# Patient Record
Sex: Female | Born: 1940 | Race: Black or African American | Hispanic: No | Smoking: Former smoker
Health system: Southern US, Community
[De-identification: ages and names within clinical notes are randomized; demographics above are authoritative.]

## PROBLEM LIST (undated history)

## (undated) DIAGNOSIS — K5900 Constipation, unspecified: Secondary | ICD-10-CM

## (undated) DIAGNOSIS — E785 Hyperlipidemia, unspecified: Secondary | ICD-10-CM

## (undated) DIAGNOSIS — F0390 Unspecified dementia without behavioral disturbance: Secondary | ICD-10-CM

## (undated) DIAGNOSIS — N390 Urinary tract infection, site not specified: Secondary | ICD-10-CM

## (undated) DIAGNOSIS — Q245 Malformation of coronary vessels: Secondary | ICD-10-CM

## (undated) DIAGNOSIS — I679 Cerebrovascular disease, unspecified: Secondary | ICD-10-CM

## (undated) DIAGNOSIS — F32A Depression, unspecified: Secondary | ICD-10-CM

## (undated) DIAGNOSIS — R4182 Altered mental status, unspecified: Secondary | ICD-10-CM

## (undated) DIAGNOSIS — B009 Herpesviral infection, unspecified: Secondary | ICD-10-CM

## (undated) DIAGNOSIS — R1084 Generalized abdominal pain: Secondary | ICD-10-CM

## (undated) DIAGNOSIS — K219 Gastro-esophageal reflux disease without esophagitis: Secondary | ICD-10-CM

## (undated) DIAGNOSIS — F329 Major depressive disorder, single episode, unspecified: Secondary | ICD-10-CM

---

## 2008-02-02 ENCOUNTER — Inpatient Hospital Stay (HOSPITAL_COMMUNITY): Admission: EM | Admit: 2008-02-02 | Discharge: 2008-02-05 | Payer: Self-pay | Admitting: Cardiology

## 2008-03-06 ENCOUNTER — Inpatient Hospital Stay (HOSPITAL_COMMUNITY): Admission: EM | Admit: 2008-03-06 | Discharge: 2008-03-16 | Payer: Self-pay | Admitting: *Deleted

## 2008-07-20 ENCOUNTER — Observation Stay (HOSPITAL_COMMUNITY): Admission: EM | Admit: 2008-07-20 | Discharge: 2008-07-23 | Payer: Self-pay | Admitting: Emergency Medicine

## 2009-01-05 ENCOUNTER — Emergency Department (HOSPITAL_COMMUNITY): Admission: EM | Admit: 2009-01-05 | Discharge: 2009-01-05 | Payer: Self-pay | Admitting: Emergency Medicine

## 2009-01-12 ENCOUNTER — Emergency Department (HOSPITAL_COMMUNITY): Admission: EM | Admit: 2009-01-12 | Discharge: 2009-01-12 | Payer: Self-pay | Admitting: Emergency Medicine

## 2009-01-19 ENCOUNTER — Inpatient Hospital Stay (HOSPITAL_COMMUNITY): Admission: EM | Admit: 2009-01-19 | Discharge: 2009-01-20 | Payer: Self-pay | Admitting: Emergency Medicine

## 2009-01-23 ENCOUNTER — Emergency Department (HOSPITAL_COMMUNITY): Admission: EM | Admit: 2009-01-23 | Discharge: 2009-01-23 | Payer: Self-pay | Admitting: Emergency Medicine

## 2009-02-18 ENCOUNTER — Emergency Department (HOSPITAL_COMMUNITY): Admission: EM | Admit: 2009-02-18 | Discharge: 2009-02-18 | Payer: Self-pay | Admitting: Emergency Medicine

## 2010-01-17 ENCOUNTER — Inpatient Hospital Stay (HOSPITAL_COMMUNITY)
Admission: EM | Admit: 2010-01-17 | Discharge: 2010-01-20 | Payer: Self-pay | Source: Home / Self Care | Admitting: Emergency Medicine

## 2010-05-24 LAB — CBC
HCT: 27.9 % — ABNORMAL LOW (ref 36.0–46.0)
HCT: 35.4 % — ABNORMAL LOW (ref 36.0–46.0)
Hemoglobin: 11.4 g/dL — ABNORMAL LOW (ref 12.0–15.0)
MCH: 29.1 pg (ref 26.0–34.0)
MCHC: 32.6 g/dL (ref 30.0–36.0)
MCV: 90.3 fL (ref 78.0–100.0)
Platelets: 175 10*3/uL (ref 150–400)
RBC: 3.92 MIL/uL (ref 3.87–5.11)
RDW: 12.9 % (ref 11.5–15.5)
RDW: 13.1 % (ref 11.5–15.5)
WBC: 14.5 10*3/uL — ABNORMAL HIGH (ref 4.0–10.5)

## 2010-05-24 LAB — DIFFERENTIAL
Basophils Absolute: 0 10*3/uL (ref 0.0–0.1)
Basophils Absolute: 0 10*3/uL (ref 0.0–0.1)
Basophils Relative: 0 % (ref 0–1)
Eosinophils Absolute: 0.2 10*3/uL (ref 0.0–0.7)
Eosinophils Relative: 1 % (ref 0–5)
Lymphocytes Relative: 20 % (ref 12–46)
Monocytes Absolute: 1 10*3/uL (ref 0.1–1.0)
Neutro Abs: 6.3 10*3/uL (ref 1.7–7.7)
Neutrophils Relative %: 65 % (ref 43–77)

## 2010-05-24 LAB — BASIC METABOLIC PANEL
BUN: 24 mg/dL — ABNORMAL HIGH (ref 6–23)
Calcium: 8.9 mg/dL (ref 8.4–10.5)
Chloride: 104 mEq/L (ref 96–112)
GFR calc Af Amer: 45 mL/min — ABNORMAL LOW (ref >60)
GFR calc non Af Amer: 50 mL/min — ABNORMAL LOW (ref >60)
Glucose, Bld: 131 mg/dL — ABNORMAL HIGH (ref 70–99)
Potassium: 4.2 mEq/L (ref 3.5–5.1)

## 2010-05-24 LAB — FOLATE: Folate: 9.8 ng/mL

## 2010-05-24 LAB — COMPREHENSIVE METABOLIC PANEL
AST: 23 U/L (ref 0–37)
BUN: 16 mg/dL (ref 6–23)
CO2: 26 mEq/L (ref 19–32)
Chloride: 106 mEq/L (ref 96–112)
Creatinine, Ser: 0.9 mg/dL (ref 0.4–1.2)
GFR calc Af Amer: >60 mL/min (ref >60)
GFR calc non Af Amer: >60 mL/min (ref >60)
Total Bilirubin: 0.5 mg/dL (ref 0.3–1.2)

## 2010-05-24 LAB — URINE CULTURE

## 2010-05-24 LAB — URINALYSIS, ROUTINE W REFLEX MICROSCOPIC
Glucose, UA: NEGATIVE mg/dL
Hgb urine dipstick: NEGATIVE
Specific Gravity, Urine: 1.009 (ref 1.005–1.030)

## 2010-05-24 LAB — POCT CARDIAC MARKERS: Troponin i, poc: <0.05 ng/mL (ref 0.00–0.09)

## 2010-05-24 LAB — URINE MICROSCOPIC-ADD ON

## 2010-05-24 LAB — CARDIAC PANEL(CRET KIN+CKTOT+MB+TROPI)
CK, MB: 1.1 ng/mL (ref 0.3–4.0)
CK, MB: 1.3 ng/mL (ref 0.3–4.0)
Relative Index: INVALID (ref 0.0–2.5)
Troponin I: 0.01 ng/mL (ref 0.00–0.06)

## 2010-05-24 LAB — GLUCOSE, CAPILLARY
Glucose-Capillary: 134 mg/dL — ABNORMAL HIGH (ref 70–99)
Glucose-Capillary: 148 mg/dL — ABNORMAL HIGH (ref 70–99)
Glucose-Capillary: 175 mg/dL — ABNORMAL HIGH (ref 70–99)

## 2010-05-24 LAB — RETICULOCYTES
RBC.: 3.36 MIL/uL — ABNORMAL LOW (ref 3.87–5.11)
Retic Ct Pct: 1.3 % (ref 0.4–3.1)

## 2010-05-24 LAB — TSH: TSH: 0.476 u[IU]/mL (ref 0.350–4.500)

## 2010-05-24 LAB — FERRITIN: Ferritin: 314 ng/mL — ABNORMAL HIGH (ref 10–291)

## 2010-05-24 LAB — IRON AND TIBC: UIBC: 192 ug/dL

## 2010-05-24 LAB — HEMOGLOBIN A1C: Hgb A1c MFr Bld: 9.1 % — ABNORMAL HIGH (ref <5.7)

## 2010-05-24 LAB — VITAMIN B12: Vitamin B-12: 629 pg/mL (ref 211–911)

## 2010-05-24 LAB — TROPONIN I: Troponin I: 0.01 ng/mL (ref 0.00–0.06)

## 2010-06-15 LAB — URINALYSIS, ROUTINE W REFLEX MICROSCOPIC
Bilirubin Urine: NEGATIVE
Bilirubin Urine: NEGATIVE
Glucose, UA: 500 mg/dL — AB
Glucose, UA: NEGATIVE mg/dL
Hgb urine dipstick: NEGATIVE
Ketones, ur: NEGATIVE mg/dL
Ketones, ur: NEGATIVE mg/dL
Nitrite: NEGATIVE
Nitrite: NEGATIVE
Protein, ur: 100 mg/dL — AB
Protein, ur: NEGATIVE mg/dL
Specific Gravity, Urine: 1.019 (ref 1.005–1.030)
Specific Gravity, Urine: 1.023 (ref 1.005–1.030)
Urobilinogen, UA: 0.2 mg/dL (ref 0.0–1.0)
Urobilinogen, UA: 0.2 mg/dL (ref 0.0–1.0)
pH: 5.5 (ref 5.0–8.0)
pH: 5.5 (ref 5.0–8.0)

## 2010-06-15 LAB — GLUCOSE, CAPILLARY
Glucose-Capillary: 162 mg/dL — ABNORMAL HIGH (ref 70–99)
Glucose-Capillary: 200 mg/dL — ABNORMAL HIGH (ref 70–99)
Glucose-Capillary: 255 mg/dL — ABNORMAL HIGH (ref 70–99)

## 2010-06-15 LAB — BASIC METABOLIC PANEL
BUN: 82 mg/dL — ABNORMAL HIGH (ref 6–23)
CO2: 24 mEq/L (ref 19–32)
CO2: 28 mEq/L (ref 19–32)
Calcium: 9.1 mg/dL (ref 8.4–10.5)
Chloride: 105 mEq/L (ref 96–112)
Chloride: 107 mEq/L (ref 96–112)
Creatinine, Ser: 1.04 mg/dL (ref 0.4–1.2)
Creatinine, Ser: 2.57 mg/dL — ABNORMAL HIGH (ref 0.4–1.2)
GFR calc Af Amer: 38 mL/min — ABNORMAL LOW (ref >60)
GFR calc Af Amer: >60 mL/min (ref >60)
GFR calc non Af Amer: 19 mL/min — ABNORMAL LOW (ref >60)
Glucose, Bld: 116 mg/dL — ABNORMAL HIGH (ref 70–99)
Glucose, Bld: 153 mg/dL — ABNORMAL HIGH (ref 70–99)
Potassium: 3.8 mEq/L (ref 3.5–5.1)
Sodium: 136 mEq/L (ref 135–145)
Sodium: 141 mEq/L (ref 135–145)
Sodium: 141 mEq/L (ref 135–145)

## 2010-06-15 LAB — COMPREHENSIVE METABOLIC PANEL
ALT: 15 U/L (ref 0–35)
AST: 23 U/L (ref 0–37)
Alkaline Phosphatase: 59 U/L (ref 39–117)
CO2: 28 mEq/L (ref 19–32)
Calcium: 9.1 mg/dL (ref 8.4–10.5)
Chloride: 102 mEq/L (ref 96–112)
GFR calc Af Amer: 48 mL/min — ABNORMAL LOW (ref >60)
GFR calc non Af Amer: 39 mL/min — ABNORMAL LOW (ref >60)
Potassium: 4.4 mEq/L (ref 3.5–5.1)
Sodium: 140 mEq/L (ref 135–145)

## 2010-06-15 LAB — DIFFERENTIAL
Basophils Absolute: 0.1 K/uL (ref 0.0–0.1)
Basophils Relative: 1 % (ref 0–1)
Eosinophils Absolute: 0.1 10*3/uL (ref 0.0–0.7)
Eosinophils Absolute: 0.2 10*3/uL (ref 0.0–0.7)
Eosinophils Relative: 1 % (ref 0–5)
Lymphocytes Relative: 19 % (ref 12–46)
Lymphs Abs: 1.2 10*3/uL (ref 0.7–4.0)
Lymphs Abs: 2.2 10*3/uL (ref 0.7–4.0)
Monocytes Absolute: 0.6 10*3/uL (ref 0.1–1.0)
Monocytes Absolute: 0.7 10*3/uL (ref 0.1–1.0)
Monocytes Relative: 9 % (ref 3–12)
Monocytes Relative: 11 % (ref 3–12)
Neutro Abs: 4.4 10*3/uL (ref 1.7–7.7)
Neutrophils Relative %: 69 % (ref 43–77)
Neutrophils Relative %: 50 % (ref 43–77)

## 2010-06-15 LAB — URINE MICROSCOPIC-ADD ON

## 2010-06-15 LAB — PROTIME-INR
INR: 1 (ref 0.00–1.49)
Prothrombin Time: 13.1 s (ref 11.6–15.2)

## 2010-06-15 LAB — TSH: TSH: 0.169 u[IU]/mL — ABNORMAL LOW (ref 0.350–4.500)

## 2010-06-15 LAB — RAPID URINE DRUG SCREEN, HOSP PERFORMED
Amphetamines: NOT DETECTED
Barbiturates: NOT DETECTED
Benzodiazepines: NOT DETECTED
Cocaine: NOT DETECTED
Opiates: NOT DETECTED
Tetrahydrocannabinol: NOT DETECTED

## 2010-06-15 LAB — APTT: aPTT: 26 seconds (ref 24–37)

## 2010-06-15 LAB — BASIC METABOLIC PANEL WITH GFR
BUN: 24 mg/dL — ABNORMAL HIGH (ref 6–23)
Calcium: 9.2 mg/dL (ref 8.4–10.5)
GFR calc non Af Amer: 53 mL/min — ABNORMAL LOW (ref >60)
Glucose, Bld: 305 mg/dL — ABNORMAL HIGH (ref 70–99)
Potassium: 4.1 meq/L (ref 3.5–5.1)

## 2010-06-15 LAB — CBC
HCT: 32.6 % — ABNORMAL LOW (ref 36.0–46.0)
HCT: 38 % (ref 36.0–46.0)
Hemoglobin: 10.9 g/dL — ABNORMAL LOW (ref 12.0–15.0)
Hemoglobin: 11.1 g/dL — ABNORMAL LOW (ref 12.0–15.0)
Hemoglobin: 12.4 g/dL (ref 12.0–15.0)
MCHC: 34 g/dL (ref 30.0–36.0)
MCV: 89.7 fL (ref 78.0–100.0)
MCV: 90 fL (ref 78.0–100.0)
Platelets: 174 K/uL (ref 150–400)
RBC: 3.56 MIL/uL — ABNORMAL LOW (ref 3.87–5.11)
RBC: 3.64 MIL/uL — ABNORMAL LOW (ref 3.87–5.11)
RBC: 4.22 MIL/uL (ref 3.87–5.11)
RDW: 13 % (ref 11.5–15.5)
WBC: 6.1 10*3/uL (ref 4.0–10.5)
WBC: 6.4 10*3/uL (ref 4.0–10.5)
WBC: 6.2 10*3/uL (ref 4.0–10.5)

## 2010-06-15 LAB — URINE CULTURE: Colony Count: >=100000

## 2010-06-15 LAB — POCT CARDIAC MARKERS
CKMB, poc: 2.1 ng/mL (ref 1.0–8.0)
Myoglobin, poc: 88.2 ng/mL (ref 12–200)

## 2010-06-16 LAB — CULTURE, BLOOD (ROUTINE X 2): Culture: NO GROWTH

## 2010-06-16 LAB — CBC
HCT: 37.9 % (ref 36.0–46.0)
Hemoglobin: 12.8 g/dL (ref 12.0–15.0)
WBC: 7.2 10*3/uL (ref 4.0–10.5)

## 2010-06-16 LAB — DIFFERENTIAL
Basophils Absolute: 0.1 10*3/uL (ref 0.0–0.1)
Basophils Relative: 1 % (ref 0–1)
Monocytes Absolute: 0.3 10*3/uL (ref 0.1–1.0)
Neutro Abs: 5.6 10*3/uL (ref 1.7–7.7)
Neutrophils Relative %: 77 % (ref 43–77)

## 2010-06-16 LAB — URINALYSIS, ROUTINE W REFLEX MICROSCOPIC
Bilirubin Urine: NEGATIVE
Hgb urine dipstick: NEGATIVE
Specific Gravity, Urine: 1.018 (ref 1.005–1.030)
Urobilinogen, UA: 0.2 mg/dL (ref 0.0–1.0)

## 2010-06-16 LAB — COMPREHENSIVE METABOLIC PANEL
Alkaline Phosphatase: 68 U/L (ref 39–117)
BUN: 22 mg/dL (ref 6–23)
Chloride: 104 mEq/L (ref 96–112)
Glucose, Bld: 147 mg/dL — ABNORMAL HIGH (ref 70–99)
Potassium: 4.5 mEq/L (ref 3.5–5.1)
Total Bilirubin: 0.4 mg/dL (ref 0.3–1.2)

## 2010-06-16 LAB — URINE MICROSCOPIC-ADD ON

## 2010-06-16 LAB — POCT CARDIAC MARKERS: CKMB, poc: 1.2 ng/mL (ref 1.0–8.0)

## 2010-06-16 LAB — URINE CULTURE

## 2010-06-21 LAB — BASIC METABOLIC PANEL
CO2: 27 mEq/L (ref 19–32)
Calcium: 8.8 mg/dL (ref 8.4–10.5)
Chloride: 103 mEq/L (ref 96–112)
Creatinine, Ser: 0.77 mg/dL (ref 0.4–1.2)
GFR calc Af Amer: >60 mL/min (ref >60)
GFR calc Af Amer: >60 mL/min (ref >60)
GFR calc non Af Amer: 58 mL/min — ABNORMAL LOW (ref >60)
Glucose, Bld: 145 mg/dL — ABNORMAL HIGH (ref 70–99)
Potassium: 4 mEq/L (ref 3.5–5.1)
Sodium: 136 mEq/L (ref 135–145)
Sodium: 136 mEq/L (ref 135–145)

## 2010-06-21 LAB — CBC
HCT: 31.3 % — ABNORMAL LOW (ref 36.0–46.0)
Hemoglobin: 10.8 g/dL — ABNORMAL LOW (ref 12.0–15.0)
Hemoglobin: 12.2 g/dL (ref 12.0–15.0)
MCHC: 33.6 g/dL (ref 30.0–36.0)
MCHC: 34.5 g/dL (ref 30.0–36.0)
Platelets: 160 10*3/uL (ref 150–400)
RBC: 3.6 MIL/uL — ABNORMAL LOW (ref 3.87–5.11)
RBC: 4.15 MIL/uL (ref 3.87–5.11)
RDW: 13.8 % (ref 11.5–15.5)
WBC: 4.9 10*3/uL (ref 4.0–10.5)

## 2010-06-21 LAB — COMPREHENSIVE METABOLIC PANEL
ALT: 12 U/L (ref 0–35)
Albumin: 3.2 g/dL — ABNORMAL LOW (ref 3.5–5.2)
Alkaline Phosphatase: 53 U/L (ref 39–117)
CO2: 26 mEq/L (ref 19–32)
Calcium: 8.9 mg/dL (ref 8.4–10.5)
Chloride: 102 mEq/L (ref 96–112)
GFR calc Af Amer: >60 mL/min (ref >60)
GFR calc non Af Amer: >60 mL/min (ref >60)
Glucose, Bld: 238 mg/dL — ABNORMAL HIGH (ref 70–99)
Sodium: 137 mEq/L (ref 135–145)
Total Bilirubin: 0.6 mg/dL (ref 0.3–1.2)

## 2010-06-21 LAB — URINALYSIS, ROUTINE W REFLEX MICROSCOPIC
Bilirubin Urine: NEGATIVE
Hgb urine dipstick: NEGATIVE
Ketones, ur: NEGATIVE mg/dL
Nitrite: NEGATIVE
Protein, ur: NEGATIVE mg/dL
Urobilinogen, UA: 1 mg/dL (ref 0.0–1.0)
pH: 7 (ref 5.0–8.0)

## 2010-06-21 LAB — GLUCOSE, CAPILLARY
Glucose-Capillary: 119 mg/dL — ABNORMAL HIGH (ref 70–99)
Glucose-Capillary: 141 mg/dL — ABNORMAL HIGH (ref 70–99)
Glucose-Capillary: 148 mg/dL — ABNORMAL HIGH (ref 70–99)
Glucose-Capillary: 187 mg/dL — ABNORMAL HIGH (ref 70–99)
Glucose-Capillary: 282 mg/dL — ABNORMAL HIGH (ref 70–99)
Glucose-Capillary: 99 mg/dL (ref 70–99)

## 2010-06-21 LAB — DIFFERENTIAL
Basophils Absolute: 0 10*3/uL (ref 0.0–0.1)
Basophils Relative: 1 % (ref 0–1)
Eosinophils Absolute: 0.1 10*3/uL (ref 0.0–0.7)
Eosinophils Relative: 1 % (ref 0–5)
Lymphocytes Relative: 31 % (ref 12–46)
Lymphs Abs: 1.5 10*3/uL (ref 0.7–4.0)
Monocytes Absolute: 0.4 10*3/uL (ref 0.1–1.0)
Neutrophils Relative %: 59 % (ref 43–77)

## 2010-06-27 LAB — GLUCOSE, CAPILLARY
Glucose-Capillary: 120 mg/dL — ABNORMAL HIGH (ref 70–99)
Glucose-Capillary: 143 mg/dL — ABNORMAL HIGH (ref 70–99)
Glucose-Capillary: 145 mg/dL — ABNORMAL HIGH (ref 70–99)
Glucose-Capillary: 147 mg/dL — ABNORMAL HIGH (ref 70–99)
Glucose-Capillary: 160 mg/dL — ABNORMAL HIGH (ref 70–99)
Glucose-Capillary: 190 mg/dL — ABNORMAL HIGH (ref 70–99)

## 2010-06-27 LAB — BASIC METABOLIC PANEL
BUN: 19 mg/dL (ref 6–23)
Chloride: 102 mEq/L (ref 96–112)
GFR calc Af Amer: >60 mL/min (ref >60)
Potassium: 4.1 mEq/L (ref 3.5–5.1)

## 2010-06-27 LAB — CBC
HCT: 29.9 % — ABNORMAL LOW (ref 36.0–46.0)
MCV: 89 fL (ref 78.0–100.0)
RBC: 3.36 MIL/uL — ABNORMAL LOW (ref 3.87–5.11)
WBC: 4.7 10*3/uL (ref 4.0–10.5)

## 2010-07-26 NOTE — Procedures (Signed)
CLINICAL HISTORY:  The patient is a 70 year old back female, who [place]  [place], look confused, was brought to the  emergency.   TECHNICAL COMMENT:  An 18-channel EEG was performed based on standard  international 10-20 system.  Total recording time is 33.6 minutes.  A  17th-channel dedicated to EKG which demonstrated normal sinus rhythm of  60 beats per minute.   Upon awakening, the posterior background activity demonstrated marked  asymmetry, there was persistent slowing, mild dysrhythmic, theta  activity with some intermittent theta frequency activity noticed at left  T3, T5, and T7 leads, but there was no definite epileptiform discharge.  There was occasionally T3 sharp transient activity, the right side  posterior activity was 9 Hz, and reactive to eye opening and closure.  Again, there is no epileptiform discharge recorded.  Photic stimulation  and hyperventilation was performed.  There was persistent left temporal  area slowing.   CONCLUSION:  This is an abnormal awake EEG.  There is evidence of focal  slowing involving the left temporal region, could represent underlying  structural lesion or postictal event, MRI studies warranted.      Levert Feinstein, MD  Electronically Signed     ZO:[id number]  D:  02/05/2008 20:05:32  T:  02/06/2008 03:56:04  Job #:  [id number]

## 2010-07-26 NOTE — Discharge Summary (Signed)
[name] [name], [name] [name]             ACCOUNT NO.:  [id number]   MEDICAL RECORD NO.:  [id number]          PATIENT TYPE:  INP   LOCATION:  6709                         FACILITY:  [facility]   PHYSICIAN:  Osvaldo Shipper. Spruill, M.D.DATE OF BIRTH:  1941/01/05   DATE OF ADMISSION:  03/06/2008  DATE OF DISCHARGE:  03/13/2008                               DISCHARGE SUMMARY   ADMISSION AND PROVISIONAL DIAGNOSES:  1. Syncope.  2. Hyperglycemia.  3. Dementia.  4. Dehydration.  5. History of cerebrovascular accident.  6. History of tobacco abuse.   DISCHARGE DIAGNOSES:  1. Dementia.  2. Near syncope.  3. Orthostatic hypotension.  4. Dehydration.  5. Uncontrolled diabetes mellitus.  6. Hypertension.  7. History of remote stroke.   BRIEF HISTORY AND REASON FOR ADMISSION:  This is one of several  admissions for this 70 year old black woman with a history of dementia,  previous stroke who was presented to the hospital with a difficult  history.  The patient had primarily stated to [facility] that  she had passed out and [facility] was called at 3:20 in the  morning.   When the patient was first evaluated in the emergency room, she was  found to have a blood sugar of 583.  She was subsequently admitted to  the hospital for treatment of suspected dehydration and orthostatic  hypotension.  The patient was admitted in my absence by Dr. Rinaldo Cloud.   LABORATORY STUDIES:  Laboratory studies revealed the following:  On  initial presentation, the patient's serum glucose was recorded at 449,  serum sodium 135, potassium 4.6, chloride 99, CO2 content 27, creatinine  1.38.  The patient's CBC revealed a white blood cell count of 7000,  hemoglobin 12.0, hematocrit 36.9.  The patient also had urinalysis,  which revealed greater than 1000 mg/dL of urine glucose.  Serum troponin  was 0.02 and CK was 52 with no CK-MB present.  At the time of discharge,  the patient's BMET revealed serum  sodium of 136, potassium 4.1, chloride  102, CO2 content 27, glucose 150, BUN 19, creatinine 0.74.   RADIOLOGIC STUDIES:  Chest x-ray, mild cardiomegaly without acute  disease.  MRI was normal of the patient's brain some time ago and back  in November and did not reveal any intracranial mass, several old  infarcts were noted.  No acute infarct this is February 04, 2008, not  repeated on this admission.   HOSPITAL COURSE:  The patient was admitted to the hospital for  uncontrolled diabetes mellitus with hyperglycemia, dehydration, and  confusion.  It was reported that she might have had a syncopal episode,  but this was not so far.  The patient was noted to have some probable  orthostatic hypotension.  After admission, she was placed on a low-  cholesterol 1800-calorie ADA diet and baseline laboratory studies  including a CBC, CMET, PT/PTT, hemoglobin A1c, CPK, and  electrocardiogram was also obtained.  Chest x-ray was also unrevealing.  The patient's urinalysis did reveal glycosuria, and after initial orders  were written, the patient was placed on IV fluids of half-normal  saline  at 125 mL an hour.  She was continued on Actos 30, metformin 500 mg p.o.  b.i.d.  The patient also was given metoprolol 12.5 mg p.o. b.i.d. for  blood pressure control and subsequently started on simvastatin 40 mg  p.o. daily.   A nutrition consult was obtained, and the patient began to eat fairly  well.  She was placed on sliding scale insulin coverage and her blood  sugars responded nicely.   Diovan 80 mg p.o. daily was added to the patient's medical regimen, and  a social service consult was obtained to evaluate possibility of  placement in a skilled nursing facility.   The patient did not offer much history throughout her hospital course.  She was obviously confused.  She had no evidence of syncope and her  orthostatic hypotension resolved.  The patient has progressed to the  point where she can be  transferred to a skilled nursing facility and  continue monitoring of her mental status can be performed at the new  institution.   She will be released today on the following medications:  1. Zocor 40 mg p.o. daily.  2. Pantoprazole 40 mg p.o. daily.  3. Actos 30 mg p.o. daily.  4. Metformin 500 mg p.o. b.i.d.  5. Aspirin 81 mg p.o. daily.  6. Lopressor 12.5 mg p.o. b.i.d.  7. Diovan 80 mg p.o. daily.   Discharge diet will be 2 g sodium-restricted diet.  She will be followed  by a new staff at [facility].   OVERALL DISPOSITION AT DISCHARGE:  The patient is stable and will be  released to the new physicians at [facility].      Osvaldo Shipper. Spruill, M.D.  Electronically Signed     JOS/MEDQ  D:  03/13/2008  T:  03/13/2008  Job:  [id number]

## 2010-07-26 NOTE — Discharge Summary (Signed)
[name] [name], [name] [name]             ACCOUNT NO.:  [id number]   MEDICAL RECORD NO.:  [id number]          PATIENT TYPE:  EMS   LOCATION:  MAJO                         FACILITY:  [facility]   PHYSICIAN:  Fleet Contras, M.D.    DATE OF BIRTH:  [birth year]   DATE OF ADMISSION:  07/20/2008  DATE OF DISCHARGE:  07/23/2008                               DISCHARGE SUMMARY   ADMITTING PHYSICIAN:  Fleet Contras, MD   HISTORY OF PRESENT ILLNESS:  Please see the dictated H and P for full  details of presentation.  In summary, Ms. Ahmad is a 70 year old  African American lady with multiple medical problems that includes  systemic hypertension, type 2 diabetes mellitus, dyslipidemia, chronic  cerebrovascular disease, and dementia.  She was admitted via the  emergency room at [facility] after her sister brought her to  the emergency room with complaints of being poorly responsive in the  morning of presentation less than usual.  There was no history of any  dizziness or passing or falls, no history of seizures, no history of  weakness of extremities, slurring of speech, but she was concerned that  she was not responding as of normal as usual, decided to bring her to  the emergency room.  On arrival at the ED, she was noted to be alert and  oriented.  Her initial vital signs showed a blood pressure of 166/66,  heart rate of 55, respiratory rate of 12, temperature 97.6, O2 sat on  room air of 100%.  Her glucose was 282.  CT scan of the head showed  extensive ischemic changes without acute infarct or hemorrhage.  Other  aspects of her laboratory data include a CBC, liver panel, renal panel,  and urinalysis, were all within normal limits.  The patient wanted to be  returned home, but her sister made a point that she could not care for  her by herself, and the patient was not able to assist with her care in  her activities of daily living including bathing, dressing, feeding, and  therefore needed  some respite.  She was, therefore, admitted to the  hospital with plan for her to be discharged to a skilled nursing  facility.  On admission, she was placed on a regular bed.  She was  started on 2-g sodium, carb-modified medium diet.  She was on bedrest  with bedside commode.  Vital signs were checked q.4 h.  CBGs were  monitored a.c. and nightly and covered with sliding scale NovoLog  insulin.  She was on DVT prophylaxis with subcutaneous Lovenox and GI  prophylaxis with Protonix.  She received a gentle IV hydration, and her  usual home medications were continued.  She remained stable throughout  the course of hospitalization and tolerated full diet without any  problems, and she is now considered stable for discharge to a skilled  nursing facility once a bed becomes available.   ADMISSION DIAGNOSES:  1. Mental status change, this has now resolved.  2. Failure to thrive with deconditioning.  3. Type 2 diabetes mellitus.  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  5. History of chronic cerebrovascular disease.  6. Systemic hypertension, well controlled.   CONDITION ON DISCHARGE:  She is stable.   DISPOSITION:  Skilled nursing facility.   DISCHARGE MEDICATIONS:  1. Aspirin 325 mg p.o. daily.  2. Actos 30 mg daily.  3. Diovan 80 mg daily.  4. Metformin 500 mg b.i.d.  5. Metoprolol 25 mg b.i.d.  6. Zocor 40 mg daily.  7. Protonix 40 mg daily.  8. Lantus insulin 10 units at bedtime.   The discharge/plan was discussed with her and her sister, and their  questions answered.      Fleet Contras, M.D.  Electronically Signed     EA/MEDQ  D:  07/22/2008  T:  07/23/2008  Job:  [id number]

## 2010-07-26 NOTE — H&P (Signed)
[name] [name], [name] [name]             ACCOUNT NO.:  [id number]   MEDICAL RECORD NO.:  [id number]          PATIENT TYPE:  INP   LOCATION:  5504                         FACILITY:  [facility]   PHYSICIAN:  Fleet Contras, M.D.    DATE OF BIRTH:  05-29-1940   DATE OF ADMISSION:  07/20/2008  DATE OF DISCHARGE:                              HISTORY & PHYSICAL   PRESENTING COMPLAINTS:  Unresponsiveness.   HISTORY OF PRESENT ILLNESS:  [name] [name] is a 70 year old African  American lady with past medical history significant for systemic  hypertension, type 2 diabetes mellitus, dyslipidemia, chronic  cerebrovascular disease, and dementia.  She presented to the emergency  room at [facility] brought in by her daughter with finding of  poorly responsive this morning, less than usual.  There was no history  of dizziness or passing out.  No history of seizures.  She did not have  any fevers or chills.  No cough, cold, sputum production, or hemoptysis.  She did not have any abdominal pain, nausea, vomiting, or diarrhea.  She  had no history of chest pain or shortness of breath, but the sister was  concerned that she was not responding as normal, therefore decided to  bring her to the emergency room.  On arrival at the emergency room, she  was apparently alert and oriented and her initial CBG check was 282.  She received NovoLog insulin.  Her vital signs showed a blood pressure  of 167/66, heart rate of 55, respiratory rate of 12, temperature was  97.6, and O2 sat on room air was 100%.  Laboratory data including a CT  scan of the head showed extensive ischemic changes with no acute infarct  or hemorrhage.  Other laboratory data essentially were within normal  limits including a CBC, BMET, liver panel, and urinalysis.  As the  patient was being planned to be discharged home, her sister commented  that she was becoming more difficult to care for at home and the patient  is not able to assist with her  care including bathing, dressing,  feeding, and moving around the house.  So, it was therefore thought that  the patient should be admitted to the hospital and then plan for  discharge to skilled nursing facility.   PAST MEDICAL HISTORY:  1. Systemic hypertension.  2. Type 2 diabetes mellitus.  3. Cerebrovascular disease.  4. Dementia.  5. Dyslipidemia.  6. Progressive decline in condition.   ALLERGIES:  She has no known drug allergies.   MEDICATIONS:  1. Aspirin 325 mg p.o. daily.  2. Actos 30 mg daily.  3. Diovan 80 mg daily.  4. Metformin 500 mg b.i.d.  5. Metoprolol 25 mg b.i.d.  6. Zocor 40 mg daily.  7. Protonix 40 mg daily.  8. Lantus insulin 10 units at bedtime.   FAMILY AND SOCIAL HISTORY:  She currently lives with her daughter.  She  spent some time in the nursing home over the holiday.  The daughter  decided to take her home but apparently she is having more difficulty  caring for her at home.  She  does not smoke, use any alcohol or illicit  drugs.  She has a history of heavy alcohol use in the past and quit  tobacco use about 6 years ago.   REVIEW OF SYSTEMS:  Essentially as above.   PHYSICAL EXAMINATION:  GENERAL:  She is lying comfortably in the  hospital bed, not in acute respiratory or painful distress.  She is not  pale.  She is not icteric.  She is not cyanosed.  VITAL SIGNS:  Blood pressure of 156/68, heart rate of 92, respiratory  rate of 20, temperature is 97.9, and O2 sat on room air is 100%.  NECK:  Supple with no elevated JVD.  No cervical lymphadenopathy.  HEART:  Heart sounds 1 and 2 are heard.  CHEST:  Clear to auscultation with no rales, rhonchi, or wheezes.  ABDOMEN:  Obese, soft, and nontender.  No masses.  Bowel sounds are  present.  EXTREMITIES:  No edema or calf tenderness.  NEUROLOGIC:  Cranial nerves are intact.  Face is symmetric and she moves  all her extremities normally, although she does not move the right leg  as well as the left  side.   LABORATORY DATA:  White count is 4.9, hemoglobin 12.2, hematocrit 36.4,  and platelet count of 160.  Sodium is 137, potassium 4.0, chloride 102,  bicarbonate of 26, BUN is 12, creatinine 0.78, and glucose is 238.  Liver function tests essentially within normal limits.  Albumin is 3.2  and calcium is 8.8.  CT scan of the head is reported as showing  extensive chronic ischemic changes.  No identifiable acute or subacute  lesion.  There are multiple areas of chronic brain injury.  Urinalysis  is negative for nitrite or leukocyte esterase or blood.   ASSESSMENT:  This is an another admission for this 70 year old African  American lady with multiple medical problems presenting with mental  status change, most likely due to progression of dementia but no  significant finding on physical exam, laboratory data, or CT scan of the  head.  She will be admitted to the hospital for failure to thrive and  plan for her to be discharged to a skilled nursing facility.   ADMISSION DIAGNOSES:  1. Failure to thrive.  2. Mental status change.  3. Uncontrolled type 2 diabetes mellitus.  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  5. History of chronic cerebrovascular disease.   PLAN OF CARE:  She will be placed on regular bed, 2 grams sodium carb  modified medium diet, and will be on bedrest with bedside commode.  Vital signs q.4 h.  CBGs will be checked before meals and at bedtime and  covered with sliding scale NovoLog insulin.  She will be on DVT  prophylaxis with subcutaneous Lovenox and nursing home placement will be  arranged.  This plan of care has been discussed with her and her  questions answered.      Fleet Contras, M.D.  Electronically Signed     EA/MEDQ  D:  07/21/2008  T:  07/21/2008  Job:  [id number]

## 2010-07-26 NOTE — Consult Note (Signed)
NAMEMEDEA, DEINES             ACCOUNT NO.:  [id number]   MEDICAL RECORD NO.:  [id number]          PATIENT TYPE:  INP   LOCATION:                               FACILITY:  [facility]   PHYSICIAN:  Marlan Palau, M.D.  DATE OF BIRTH:  04/09/1940   DATE OF CONSULTATION:  02/03/2008  DATE OF DISCHARGE:                                 CONSULTATION   HISTORY OF PRESENT ILLNESS:  [name] [name] is a 70 year old black  female, born on 04-09-1940, with a history of hypertension,  dyslipidemia, diabetes.  This patient has apparently come to live with  her sister in the [place] area from [place].  The patient had  sustained a stroke event at least 6 months ago and came to live in [place] following that.  This patient has apparently required some  assistance for activities of daily living including bathing and dressing  and apparently goes everywhere with her sister and does not generally go  out of the house by herself.  The patient however was found by herself  wandering at the bus station on the day of admission.  The patient is  brought to the hospital by [facility] because she  appeared to be confused.  A CT scan of the brain was done in the ER and  shows multiple old cortical infarcts, affecting the right frontal and  bilateral parietal lobes.  The patient was admitted for further  evaluation.  Neurology has been asked to see this patient for further  evaluation.  There is some question the patient may have a right lower  lobe infiltrate with pneumonia.   Past medical history is significant for:  1. History of hypertension.  2. Cerebrovascular disease with recent stroke.  3. Altered mental status.  4. Probable organic brain syndrome prior to admission.  5. Dyslipidemia.  6. Questionable history of MI in the past.   The patient has no known allergies.   She was smoking cigarettes a pack a day but quit 6 years ago and has a  history of heavy  alcohol abuse but does not drink currently.   Current medications at this time include:  1. Norvasc 5 mg daily.  2. Aspirin 325 mg daily.  3. Sliding scale insulin.  4. Lovenox 40 mg subcu daily.  5. Metformin 500 mg daily.  6. The patient had been on Lopressor 25 mg b.i.d. but is on hold for      bradycardia.  7. Avelox 400 mg daily.  8. Benicar 20 mg b.i.d.  9. Protonix 40 mg daily.  10.Actos 30 mg daily.  11.Crestor 10 mg daily.   SOCIAL HISTORY:  The patient is not working, lives with her sister in  the [place], area.   The patient's family medical history is unobtainable.   Review of systems is notable for problems with fatigue.  The patient  denies any pain, such as headache, chest pain, abdominal pain with  shortness of breath, nausea, vomiting.  No problems controlling the  bowels or bladder.  No problems swallowing.  The patient denies any  numbness or weakness in the arms or legs.   PHYSICAL EXAMINATION:  VITAL SIGNS:  Blood pressure is 142/70, heart  rate 53, respiratory rate 20, temperature afebrile.  GENERAL:  The patient is a minimally to moderately obese black female  who is alert and cooperative at the time of examination.  HEENT:  Head is atraumatic.  Eyes, pupils are round and reactive to  light.  Disks are flat bilaterally.  NECK:  Supple.  No carotid bruits noted.  RESPIRATORY:  Clear.  CARDIOVASCULAR:  Regular rate and rhythm.  No obvious murmurs or rubs  noted.  EXTREMITIES:  Trace edema at the ankles.  NEUROLOGIC:  Cranial nerves as above.  Facial symmetry appears to be  present.  The patient has inability to track the examiner's fingers but  will track the face, seems to have full lateral movements, blinks to  threat bilaterally.  Speech is fairly well enunciated.  No obvious  aphasia is noted.  The patient has good strength of all fours but does  not move the right leg as well as the left and complains of pain with  manipulation.   The patient has good pinprick, soft touch, vibration  sensation throughout.  The patient is able to reach out and touch  examiner's finger but has significant apraxia, will not perform finger-  nose-finger or heel-to-shin.  The patient was not ambulated.  Deep  tendon reflexes depressed but symmetric.  Toes are neutral bilaterally.  No drift is seen with upper extremities.  No evidence of asterixis is  seen.   Laboratory values are notable for white count of 4.8, hemoglobin of  11.2, hematocrit of 33.1, MCV of 88.7, platelets of 154.  CBG is 234.  Sodium 139, potassium 3.6, chloride of 2.03, CO2 of 27, glucose of 65,  BUN of 22, creatinine 0.83, calcium level 8.9.  Total bilirubin 0.8,  alkaline phosphatase is 67, SGOT of 20, SGPT of 17, total protein 7.0,  albumin 3.9.  Troponin I of less than 0.05, and urine drug screen is  negative.  Alcohol level 5.  B12 level 605.  Folate 10.5.  TSH 0.5.  Urinalysis reveals specific gravity of 1.033, pH of 5.5, glucose of  greater than 1000, and the patient has 3-6 white cells.   CT is as above.   IMPRESSION:  1. History of cerebrovascular disease.  2. Probable pre-existing dementia.  3. Acute confusional state on top of chronic dementia.  4. Hypertension.  5. Diabetes.   Cause of sudden alteration in mental status is not clear.  We do need to  consider possibility of seizure or stroke event or possibly an  unrecognized hypoglycemic event.  We will pursue further workup at this  point.  The patient clearly has had what looks like multiple embolic  strokes previously that do involve the cortex and could be a focus for  seizures.   PLAN:  1. MRI scan of the brain.  2. MRI angiogram of the intracranial vessels.  3. EEG study.  4. We will follow the patient's clinical course while in house.      Marlan Palau, M.D.  Electronically Signed     CKW/MEDQ  D:  02/03/2008  T:  02/04/2008  Job:  [id number]   cc:   [facility]  Osvaldo Shipper. Spruill, M.D.

## 2010-07-29 NOTE — Discharge Summary (Signed)
[name], [name]             ACCOUNT NO.:  [id number]   MEDICAL RECORD NO.:  [id number]          PATIENT TYPE:  INP   LOCATION:  4735                         FACILITY:  [facility]   PHYSICIAN:  Osvaldo Shipper. Spruill, M.D.DATE OF BIRTH:  07-01-1940   DATE OF ADMISSION:  02/02/2008  DATE OF DISCHARGE:  02/05/2008                               DISCHARGE SUMMARY   DISCHARGE DIAGNOSES:  1. Arteriosclerotic delirium.  2. Pneumonia.  3. Cerebral atherosclerosis.  4. Diabetes mellitus type 2.  5. Hypertension.  6. Coronary atherosclerosis of the native vessels.   [name] [name] is a 70 year old patient who was seen initially in the  emergency department of the [facility] on February 02, 2008.  EMS report that the patient did not know who she was or where she was  upon their arrival.  She complained of being hungry and not eating all  day.  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  The  patient [place] and noted to be confused.  The patient was evaluated in the emergency department where a CT scan  revealed no acute intracranial abnormalities, but did confirm an old  right frontal and bilateral parietal lobe infarcts.  Chest x-ray showed  a right lower lobe pneumonia and mild cardiomegaly.  The patient was  also noted to have an elevated blood pressure of 206/88.  She was  subsequently admitted for uncontrolled hypertension with encephalopathy,  uncontrolled diabetes mellitus, right lower lobe pneumonia.   The admission history and physical was completed by Dr. Sharyn Lull.  The  daily visits were completed by Dr. Donia Guiles.   After admission, the patient's heart rate dropped to the 40s between 46  and 47.  The patient was asymptomatic with otherwise normal vital signs,  this was addressed.   A neurology consultation was obtained with Dr. Reynold Bowen.  It was  neurology opinion that the patient probably had underlying advanced  dementia.  There was no evidence of Wernicke syndrome.  They suggested  that an MRI of the brain be obtained to rule out any new event and that  a 2-D echo be obtained if the MRI was noted to be positive.   After evaluation and discussion with the family, the family requested  that the patient be placed in a nursing facility.   On February 04, 2009, the patient had an MRI which was negative for  acute CVA.  Neurology is also in agreement that this patient would need  possible 24-hour supervision given her current status.   The patient was seen by the clinical social worker.   On February 04, 2009, the patient was discharged home with her sister.  Plans are in the making for the patient for nursing facility placement.   Medications at discharge include:  1. Lopressor 25 mg b.i.d.  2. Norvasc 5 mg daily.  3. Benicar 40 mg daily.  4. Protonix 40 mg daily.  5. Crestor 10 mg daily.  6. Actos 15/850 one p.o. b.i.d.  7. Amaryl 2 mg daily.  8. Aspirin 81 mg daily.  9. Multivitamin 1 daily.   The patient is to be seen in the office in 2 weeks, sooner if any  changes, problems, or concerns.   ADDENDUM  The infiltrates that were mentioned earlier in this patient's report  were addressed with Avelox 400 mg IV daily.      Ivery Quale, P.A.      Osvaldo Shipper. Spruill, M.D.  Electronically Signed    HB/MEDQ  D:  04/14/2008  T:  04/15/2008  Job:  [id number]

## 2010-09-22 ENCOUNTER — Other Ambulatory Visit: Payer: Self-pay | Admitting: Internal Medicine

## 2010-09-22 DIAGNOSIS — Z13820 Encounter for screening for osteoporosis: Secondary | ICD-10-CM

## 2010-09-22 DIAGNOSIS — Z1231 Encounter for screening mammogram for malignant neoplasm of breast: Secondary | ICD-10-CM

## 2010-10-20 ENCOUNTER — Ambulatory Visit (HOSPITAL_COMMUNITY)
Admission: RE | Admit: 2010-10-20 | Discharge: 2010-10-20 | Disposition: A | Discharge status: Home / Self Care | Payer: Medicaid Other | Source: Ambulatory Visit | Attending: Internal Medicine | Admitting: Internal Medicine

## 2010-10-20 DIAGNOSIS — Z13820 Encounter for screening for osteoporosis: Secondary | ICD-10-CM

## 2010-10-20 DIAGNOSIS — Z1231 Encounter for screening mammogram for malignant neoplasm of breast: Secondary | ICD-10-CM

## 2010-10-25 ENCOUNTER — Other Ambulatory Visit: Payer: Self-pay | Admitting: Internal Medicine

## 2010-10-25 DIAGNOSIS — R928 Other abnormal and inconclusive findings on diagnostic imaging of breast: Secondary | ICD-10-CM

## 2010-10-31 ENCOUNTER — Other Ambulatory Visit: Payer: Medicaid Other

## 2010-11-03 ENCOUNTER — Ambulatory Visit
Admission: RE | Admit: 2010-11-03 | Discharge: 2010-11-03 | Disposition: A | Discharge status: Home / Self Care | Payer: Medicaid Other | Source: Ambulatory Visit | Attending: Internal Medicine | Admitting: Internal Medicine

## 2010-11-03 ENCOUNTER — Other Ambulatory Visit: Payer: Self-pay | Admitting: Internal Medicine

## 2010-11-03 ENCOUNTER — Ambulatory Visit
Admission: RE | Admit: 2010-11-03 | Discharge: 2010-11-03 | Disposition: A | Discharge status: Home / Self Care | Payer: Medicare Other | Source: Ambulatory Visit | Attending: Internal Medicine | Admitting: Internal Medicine

## 2010-11-03 DIAGNOSIS — R928 Other abnormal and inconclusive findings on diagnostic imaging of breast: Secondary | ICD-10-CM

## 2010-12-14 LAB — RAPID URINE DRUG SCREEN, HOSP PERFORMED
Amphetamines: NOT DETECTED
Barbiturates: NOT DETECTED
Benzodiazepines: NOT DETECTED
Cocaine: NOT DETECTED
Opiates: NOT DETECTED

## 2010-12-14 LAB — PROTIME-INR: Prothrombin Time: 12.7

## 2010-12-14 LAB — CBC
HCT: 33.1 — ABNORMAL LOW
Hemoglobin: 11.2 — ABNORMAL LOW
MCHC: 32.9
MCHC: 33.7
MCV: 88.7
MCV: 89.3
Platelets: 147 — ABNORMAL LOW
Platelets: 154
RBC: 3.74 — ABNORMAL LOW
RDW: 13.6
WBC: 4.8

## 2010-12-14 LAB — POCT CARDIAC MARKERS
Myoglobin, poc: 67.4
Troponin i, poc: <0.05

## 2010-12-14 LAB — CK TOTAL AND CKMB (NOT AT ARMC)
CK, MB: 1.2
Relative Index: INVALID
Relative Index: INVALID
Total CK: 46

## 2010-12-14 LAB — GLUCOSE, CAPILLARY
Glucose-Capillary: 218 — ABNORMAL HIGH
Glucose-Capillary: 234 — ABNORMAL HIGH
Glucose-Capillary: 251 — ABNORMAL HIGH
Glucose-Capillary: 252 — ABNORMAL HIGH
Glucose-Capillary: 258 — ABNORMAL HIGH
Glucose-Capillary: 266 — ABNORMAL HIGH
Glucose-Capillary: 374 — ABNORMAL HIGH
Glucose-Capillary: 73

## 2010-12-14 LAB — COMPREHENSIVE METABOLIC PANEL
AST: 20
Albumin: 3.9
Calcium: 9.2
Creatinine, Ser: 0.84
GFR calc Af Amer: >60
GFR calc non Af Amer: >60

## 2010-12-14 LAB — TSH
TSH: 0.492
TSH: 0.5

## 2010-12-14 LAB — DIFFERENTIAL
Eosinophils Relative: 1
Lymphocytes Relative: 26
Lymphs Abs: 1.3
Monocytes Absolute: 0.3

## 2010-12-14 LAB — HEMOGLOBIN A1C
Hgb A1c MFr Bld: 10.7 — ABNORMAL HIGH
Mean Plasma Glucose: 260
Mean Plasma Glucose: 263

## 2010-12-14 LAB — URINALYSIS, ROUTINE W REFLEX MICROSCOPIC
Glucose, UA: >1000 — AB
Ketones, ur: NEGATIVE
Leukocytes, UA: NEGATIVE
pH: 5.5

## 2010-12-14 LAB — BASIC METABOLIC PANEL WITH GFR
BUN: 22
GFR calc non Af Amer: >60
Potassium: 3.6
Sodium: 139

## 2010-12-14 LAB — BASIC METABOLIC PANEL
CO2: 27
Calcium: 8.9
Chloride: 103
Creatinine, Ser: 0.83
GFR calc Af Amer: >60
Glucose, Bld: 65 — ABNORMAL LOW

## 2010-12-14 LAB — T4, FREE: Free T4: 1.12

## 2010-12-14 LAB — URINE MICROSCOPIC-ADD ON

## 2010-12-14 LAB — FOLATE: Folate: 10.5

## 2010-12-14 LAB — CULTURE, BLOOD (ROUTINE X 2): Culture: NO GROWTH

## 2010-12-14 LAB — LIPID PANEL
Cholesterol: 207 — ABNORMAL HIGH
HDL: 46
LDL Cholesterol: 149 — ABNORMAL HIGH
Total CHOL/HDL Ratio: 4.5
Triglycerides: 61
VLDL: 12

## 2010-12-16 LAB — GLUCOSE, CAPILLARY
Glucose-Capillary: 107 mg/dL — ABNORMAL HIGH (ref 70–99)
Glucose-Capillary: 133 mg/dL — ABNORMAL HIGH (ref 70–99)
Glucose-Capillary: 133 mg/dL — ABNORMAL HIGH (ref 70–99)
Glucose-Capillary: 138 mg/dL — ABNORMAL HIGH (ref 70–99)
Glucose-Capillary: 144 mg/dL — ABNORMAL HIGH (ref 70–99)
Glucose-Capillary: 144 mg/dL — ABNORMAL HIGH (ref 70–99)
Glucose-Capillary: 145 mg/dL — ABNORMAL HIGH (ref 70–99)
Glucose-Capillary: 145 mg/dL — ABNORMAL HIGH (ref 70–99)
Glucose-Capillary: 148 mg/dL — ABNORMAL HIGH (ref 70–99)
Glucose-Capillary: 160 mg/dL — ABNORMAL HIGH (ref 70–99)
Glucose-Capillary: 162 mg/dL — ABNORMAL HIGH (ref 70–99)
Glucose-Capillary: 176 mg/dL — ABNORMAL HIGH (ref 70–99)
Glucose-Capillary: 179 mg/dL — ABNORMAL HIGH (ref 70–99)
Glucose-Capillary: 181 mg/dL — ABNORMAL HIGH (ref 70–99)
Glucose-Capillary: 207 mg/dL — ABNORMAL HIGH (ref 70–99)
Glucose-Capillary: 230 mg/dL — ABNORMAL HIGH (ref 70–99)
Glucose-Capillary: 249 mg/dL — ABNORMAL HIGH (ref 70–99)
Glucose-Capillary: 87 mg/dL (ref 70–99)

## 2010-12-16 LAB — LIPID PANEL
Cholesterol: 138 mg/dL (ref 0–200)
HDL: 49 mg/dL (ref >39)
Total CHOL/HDL Ratio: 2.8 RATIO

## 2010-12-16 LAB — CBC
HCT: 32.7 % — ABNORMAL LOW (ref 36.0–46.0)
Hemoglobin: 10.7 g/dL — ABNORMAL LOW (ref 12.0–15.0)
Hemoglobin: 10.8 g/dL — ABNORMAL LOW (ref 12.0–15.0)
MCHC: 32.5 g/dL (ref 30.0–36.0)
MCHC: 33.2 g/dL (ref 30.0–36.0)
Platelets: 165 10*3/uL (ref 150–400)
Platelets: 210 10*3/uL (ref 150–400)
RBC: 3.51 MIL/uL — ABNORMAL LOW (ref 3.87–5.11)
RDW: 13.5 % (ref 11.5–15.5)
RDW: 13.6 % (ref 11.5–15.5)
RDW: 13.6 % (ref 11.5–15.5)
WBC: 5.1 10*3/uL (ref 4.0–10.5)

## 2010-12-16 LAB — BASIC METABOLIC PANEL
BUN: 27 mg/dL — ABNORMAL HIGH (ref 6–23)
CO2: 27 mEq/L (ref 19–32)
Calcium: 8.9 mg/dL (ref 8.4–10.5)
Calcium: 8.9 mg/dL (ref 8.4–10.5)
Calcium: 9.3 mg/dL (ref 8.4–10.5)
Creatinine, Ser: 0.73 mg/dL (ref 0.4–1.2)
Creatinine, Ser: 1.38 mg/dL — ABNORMAL HIGH (ref 0.4–1.2)
GFR calc Af Amer: >60 mL/min (ref >60)
GFR calc non Af Amer: >60 mL/min (ref >60)
GFR calc non Af Amer: >60 mL/min (ref >60)
Glucose, Bld: 155 mg/dL — ABNORMAL HIGH (ref 70–99)
Glucose, Bld: 449 mg/dL — ABNORMAL HIGH (ref 70–99)
Sodium: 140 mEq/L (ref 135–145)

## 2010-12-16 LAB — COMPREHENSIVE METABOLIC PANEL
Alkaline Phosphatase: 58 U/L (ref 39–117)
BUN: 26 mg/dL — ABNORMAL HIGH (ref 6–23)
CO2: 24 mEq/L (ref 19–32)
Chloride: 105 mEq/L (ref 96–112)
GFR calc non Af Amer: 55 mL/min — ABNORMAL LOW (ref >60)
Glucose, Bld: 169 mg/dL — ABNORMAL HIGH (ref 70–99)
Potassium: 4.1 mEq/L (ref 3.5–5.1)
Total Bilirubin: 0.5 mg/dL (ref 0.3–1.2)

## 2010-12-16 LAB — DIFFERENTIAL
Basophils Absolute: 0 10*3/uL (ref 0.0–0.1)
Basophils Relative: 0 % (ref 0–1)
Monocytes Absolute: 0.8 10*3/uL (ref 0.1–1.0)
Neutro Abs: 4.3 10*3/uL (ref 1.7–7.7)
Neutrophils Relative %: 62 % (ref 43–77)

## 2010-12-16 LAB — APTT: aPTT: 26 seconds (ref 24–37)

## 2010-12-16 LAB — URINE MICROSCOPIC-ADD ON

## 2010-12-16 LAB — TROPONIN I: Troponin I: 0.02 ng/mL (ref 0.00–0.06)

## 2010-12-16 LAB — URINALYSIS, ROUTINE W REFLEX MICROSCOPIC
Bilirubin Urine: NEGATIVE
Ketones, ur: NEGATIVE mg/dL
Nitrite: NEGATIVE
Protein, ur: NEGATIVE mg/dL
pH: 5 (ref 5.0–8.0)

## 2010-12-16 LAB — PROTIME-INR: Prothrombin Time: 13.1 seconds (ref 11.6–15.2)

## 2010-12-16 LAB — POCT CARDIAC MARKERS
Myoglobin, poc: 167 ng/mL (ref 12–200)
Troponin i, poc: <0.05 ng/mL (ref 0.00–0.09)
Troponin i, poc: <0.05 ng/mL (ref 0.00–0.09)

## 2010-12-16 LAB — HEMOGLOBIN A1C

## 2010-12-16 LAB — URINE CULTURE: Colony Count: NO GROWTH

## 2011-04-06 ENCOUNTER — Other Ambulatory Visit: Payer: Self-pay | Admitting: Internal Medicine

## 2011-04-06 DIAGNOSIS — N63 Unspecified lump in unspecified breast: Secondary | ICD-10-CM

## 2011-05-04 ENCOUNTER — Ambulatory Visit
Admission: RE | Admit: 2011-05-04 | Discharge: 2011-05-04 | Disposition: A | Discharge status: Home / Self Care | Payer: Medicare Other | Source: Ambulatory Visit | Attending: Internal Medicine | Admitting: Internal Medicine

## 2011-05-04 DIAGNOSIS — N63 Unspecified lump in unspecified breast: Secondary | ICD-10-CM

## 2011-08-27 ENCOUNTER — Encounter (HOSPITAL_COMMUNITY): Payer: Self-pay | Admitting: *Deleted

## 2011-08-27 ENCOUNTER — Emergency Department (HOSPITAL_COMMUNITY)
Admission: EM | Admit: 2011-08-27 | Discharge: 2011-08-27 | Disposition: A | Discharge status: Home / Self Care | Payer: 59 | Attending: Emergency Medicine | Admitting: Emergency Medicine

## 2011-08-27 DIAGNOSIS — K219 Gastro-esophageal reflux disease without esophagitis: Secondary | ICD-10-CM | POA: Insufficient documentation

## 2011-08-27 DIAGNOSIS — Z87891 Personal history of nicotine dependence: Secondary | ICD-10-CM | POA: Insufficient documentation

## 2011-08-27 DIAGNOSIS — R55 Syncope and collapse: Secondary | ICD-10-CM | POA: Insufficient documentation

## 2011-08-27 DIAGNOSIS — E785 Hyperlipidemia, unspecified: Secondary | ICD-10-CM | POA: Insufficient documentation

## 2011-08-27 DIAGNOSIS — Z794 Long term (current) use of insulin: Secondary | ICD-10-CM | POA: Insufficient documentation

## 2011-08-27 DIAGNOSIS — E119 Type 2 diabetes mellitus without complications: Secondary | ICD-10-CM | POA: Insufficient documentation

## 2011-08-27 DIAGNOSIS — F0390 Unspecified dementia without behavioral disturbance: Secondary | ICD-10-CM | POA: Insufficient documentation

## 2011-08-27 DIAGNOSIS — Z79899 Other long term (current) drug therapy: Secondary | ICD-10-CM | POA: Insufficient documentation

## 2011-08-27 HISTORY — DX: Depression, unspecified: F32.A

## 2011-08-27 HISTORY — DX: Urinary tract infection, site not specified: N39.0

## 2011-08-27 HISTORY — DX: Cerebrovascular disease, unspecified: I67.9

## 2011-08-27 HISTORY — DX: Constipation, unspecified: K59.00

## 2011-08-27 HISTORY — DX: Altered mental status, unspecified: R41.82

## 2011-08-27 HISTORY — DX: Herpesviral infection, unspecified: B00.9

## 2011-08-27 HISTORY — DX: Gastro-esophageal reflux disease without esophagitis: K21.9

## 2011-08-27 HISTORY — DX: Unspecified dementia, unspecified severity, without behavioral disturbance, psychotic disturbance, mood disturbance, and anxiety: F03.90

## 2011-08-27 HISTORY — DX: Hyperlipidemia, unspecified: E78.5

## 2011-08-27 HISTORY — DX: Malformation of coronary vessels: Q24.5

## 2011-08-27 HISTORY — DX: Major depressive disorder, single episode, unspecified: F32.9

## 2011-08-27 HISTORY — DX: Generalized abdominal pain: R10.84

## 2011-08-27 LAB — DIFFERENTIAL
Eosinophils Relative: 3 % (ref 0–5)
Lymphocytes Relative: 36 % (ref 12–46)
Lymphs Abs: 1.8 10*3/uL (ref 0.7–4.0)
Monocytes Absolute: 0.4 10*3/uL (ref 0.1–1.0)
Monocytes Relative: 8 % (ref 3–12)
Neutro Abs: 2.6 10*3/uL (ref 1.7–7.7)

## 2011-08-27 LAB — BASIC METABOLIC PANEL
BUN: 14 mg/dL (ref 6–23)
CO2: 28 mEq/L (ref 19–32)
Calcium: 9.4 mg/dL (ref 8.4–10.5)
Chloride: 103 mEq/L (ref 96–112)
Creatinine, Ser: 0.73 mg/dL (ref 0.50–1.10)
Glucose, Bld: 148 mg/dL — ABNORMAL HIGH (ref 70–99)

## 2011-08-27 LAB — CBC
HCT: 33.9 % — ABNORMAL LOW (ref 36.0–46.0)
Hemoglobin: 10.7 g/dL — ABNORMAL LOW (ref 12.0–15.0)
MCV: 88.3 fL (ref 78.0–100.0)
RBC: 3.84 MIL/uL — ABNORMAL LOW (ref 3.87–5.11)
RDW: 13.2 % (ref 11.5–15.5)
WBC: 5 10*3/uL (ref 4.0–10.5)

## 2011-08-27 LAB — URINALYSIS, ROUTINE W REFLEX MICROSCOPIC
Glucose, UA: NEGATIVE mg/dL
Hgb urine dipstick: NEGATIVE
Protein, ur: 30 mg/dL — AB
pH: 5.5 (ref 5.0–8.0)

## 2011-08-27 LAB — URINE MICROSCOPIC-ADD ON

## 2011-08-27 MED ORDER — SODIUM CHLORIDE 0.9 % IV SOLN
INTRAVENOUS | Status: DC
  Administered 2011-08-27: 1000 mL via INTRAVENOUS

## 2011-08-27 NOTE — Discharge Instructions (Signed)
Your EKG does not show signs of a heart attack or irregular heartbeat.  Your blood and urine tests do not show any significant abnormalities.  Followup with your Dr. for reevaluation.  Return for worse or uncontrolled symptoms

## 2011-08-27 NOTE — ED Notes (Signed)
[id number]<QM> Expected date:<BR> Expected time: 5:38 PM<BR> Means of arrival:Ambulance<BR> Comments:<BR> NEAR SYNCOPY

## 2011-08-27 NOTE — ED Notes (Addendum)
Per EMS report, pt from nursing home [facility]): was on the bathroom and had a BM, then passed out, staff at facility called EMS to take pt to hospital to be evaluated, Enroute: CBG 156, BP 101/72, HR 70,  Pt verbally denies fall or any injuries, headache, N/V, dizziness, weakness and pain. No signs of skin injury noted.

## 2011-08-27 NOTE — ED Provider Notes (Addendum)
History     CSN: [id number]  Arrival date & time 08/27/11  1717   First MD Initiated Contact with Patient 08/27/11 1803      Chief Complaint  Patient presents with  . Near Syncope    (Consider location/radiation/quality/duration/timing/severity/associated sxs/prior treatment) The history is provided by the patient, the nursing home and the EMS personnel. The history is limited by the condition of the patient.  PT has dementia.  Level 5 caveat for dementia.  She was going to the bathroom, having a BM.  She almost fainted.   She denies loc.  She denies pain or palpitations. She denies cp, cough, sob, n/v/diarrhea or uti sxs.   She denies  Recent illness.   She denies change in her dm meds. She denies missing meals.  She is asx now.    Past Medical History  Diagnosis Date  . Diabetes mellitus   . Herpes simplex   . Hyperlipidemia   . Urinary tract infection   . Altered mental status   . Esophageal reflux   . Congenital coronary artery anomaly   . Cerebrovascular disease   . Depression   . Constipation   . Dementia   . Generalized abdominal pain     History reviewed. No pertinent past surgical history.  No family history on file.  History  Substance Use Topics  . Smoking status: Former Games developer  . Smokeless tobacco: Not on file  . Alcohol Use: No    OB History    Grav Para Term Preterm Abortions TAB SAB Ect Mult Living                  Review of Systems  Constitutional: Negative for fever.  HENT: Negative for congestion.   Respiratory: Negative for cough.   Cardiovascular: Negative for chest pain and palpitations.  Gastrointestinal: Negative for nausea, vomiting, abdominal pain and diarrhea.  Genitourinary: Negative for dysuria.  Neurological: Positive for syncope.  Psychiatric/Behavioral: Negative for confusion.  All other systems reviewed and are negative.    Allergies  Review of patient's allergies indicates no known allergies.  Home Medications    Current Outpatient Rx  Name Route Sig Dispense Refill  . ASPIRIN 325 MG PO TABS Oral Take 325 mg by mouth daily.    Marland Kitchen VITAMIN D 1000 UNITS PO TABS Oral Take 2,000 Units by mouth daily.    . INSULIN ASPART 100 UNIT/ML [place] SOLN Subcutaneous Inject 3 Units into the skin 3 (three) times daily before meals. Only takes 3 units if cbg >150    . INSULIN GLARGINE 100 UNIT/ML [place] SOLN Subcutaneous Inject 20 Units into the skin at bedtime.    Marland Kitchen MEMANTINE HCL 10 MG PO TABS Oral Take 10 mg by mouth 2 (two) times daily.    Marland Kitchen METFORMIN HCL 500 MG PO TABS Oral Take 500 mg by mouth 2 (two) times daily with a meal.    . POLYETHYL GLYCOL-PROPYL GLYCOL 0.4-0.3 % OP SOLN Ophthalmic Apply 1 drop to eye 3 (three) times daily.    Marland Kitchen RANITIDINE HCL 75 MG PO TABS Oral Take 150 mg by mouth at bedtime.    Marland Kitchen ROSUVASTATIN CALCIUM 10 MG PO TABS Oral Take 10 mg by mouth daily.    . SERTRALINE HCL 50 MG PO TABS Oral Take 50 mg by mouth daily.      BP 147/92  Pulse 66  Temp 98 F (36.7 C) (Oral)  Resp 20  SpO2 100%  Physical Exam  Nursing note and  vitals reviewed. Constitutional: She appears well-developed and well-nourished. No distress.  HENT:  Head: Normocephalic and atraumatic.  Eyes: Conjunctivae are normal.  Neck: Normal range of motion. Neck supple.       No bruits  Cardiovascular: Normal rate.   No murmur heard. Pulmonary/Chest: Effort normal and breath sounds normal. She has no rales.  Abdominal: Soft. She exhibits distension.  Musculoskeletal: Normal range of motion. She exhibits no edema and no tenderness.  Neurological: She is alert.       Moves all extremities Grip strength 3/5 bilaterally. No tremor Symmetric facial movements.  Skin: Skin is warm and dry.    ED Course  Procedures (including critical care time)  Labs Reviewed  CBC - Abnormal; Notable for the following:    RBC 3.84 (*)     Hemoglobin 10.7 (*)     HCT 33.9 (*)     All other components within normal limits  BASIC  METABOLIC PANEL - Abnormal; Notable for the following:    Glucose, Bld 148 (*)     GFR calc non Af Amer 85 (*)     All other components within normal limits  DIFFERENTIAL  URINALYSIS, ROUTINE W REFLEX MICROSCOPIC   No results found.   No diagnosis found.  ECG. Normal sinus rhythm at 65 beats per minute. Left axis deviation. Normal intervals. Occasional PVC. Impression normal sinus rhythm with occasional PVC, and left axis deviation  MDM  Near syncope while having BM Vasovagal syncope. sxs resolved. No signs uti, arrhythmia. Nontoxic. No distress. No neuro deficits.          Cheri Guppy, MD 08/27/11 1931  Cheri Guppy, MD 10/08/11 [phone]

## 2012-07-02 ENCOUNTER — Non-Acute Institutional Stay (SKILLED_NURSING_FACILITY): Payer: Medicare Other | Admitting: Adult Health

## 2012-07-02 ENCOUNTER — Encounter: Payer: Self-pay | Admitting: Adult Health

## 2012-07-02 DIAGNOSIS — I639 Cerebral infarction, unspecified: Secondary | ICD-10-CM | POA: Insufficient documentation

## 2012-07-02 DIAGNOSIS — E1149 Type 2 diabetes mellitus with other diabetic neurological complication: Secondary | ICD-10-CM | POA: Insufficient documentation

## 2012-07-02 DIAGNOSIS — F0150 Vascular dementia without behavioral disturbance: Secondary | ICD-10-CM

## 2012-07-02 DIAGNOSIS — F329 Major depressive disorder, single episode, unspecified: Secondary | ICD-10-CM | POA: Insufficient documentation

## 2012-07-02 DIAGNOSIS — E559 Vitamin D deficiency, unspecified: Secondary | ICD-10-CM | POA: Insufficient documentation

## 2012-07-02 DIAGNOSIS — E1165 Type 2 diabetes mellitus with hyperglycemia: Secondary | ICD-10-CM

## 2012-07-02 DIAGNOSIS — K219 Gastro-esophageal reflux disease without esophagitis: Secondary | ICD-10-CM | POA: Insufficient documentation

## 2012-07-02 DIAGNOSIS — I10 Essential (primary) hypertension: Secondary | ICD-10-CM | POA: Insufficient documentation

## 2012-07-02 DIAGNOSIS — E118 Type 2 diabetes mellitus with unspecified complications: Secondary | ICD-10-CM

## 2012-07-02 DIAGNOSIS — I6350 Cerebral infarction due to unspecified occlusion or stenosis of unspecified cerebral artery: Secondary | ICD-10-CM

## 2012-07-02 NOTE — Assessment & Plan Note (Signed)
Is without change in status is taking asa 325 mg daily

## 2012-07-02 NOTE — Assessment & Plan Note (Signed)
Is stable is taking lantus 20 units nightly and novolog 5 units prior to meals for cbg >=150

## 2012-07-02 NOTE — Assessment & Plan Note (Signed)
Is stable is taking norvasc 5 mg daily

## 2012-07-02 NOTE — Assessment & Plan Note (Signed)
Is without change in status is taking aricept 10 mg daily takes namenda 10 mg twice daily

## 2012-07-02 NOTE — Progress Notes (Signed)
Patient ID: [name] [name], female   DOB: 07/20/1940, 72 y.o.   MRN: [id number]  Chief Complaint  Patient presents with  . Medical Managment of Chronic Issues    HPI:  Type II or unspecified type diabetes mellitus with unspecified complication, uncontrolled Is stable is taking lantus 20 units nightly and novolog 5 units prior to meals for cbg >=150  Essential hypertension, benign Is stable is taking norvasc 5 mg daily   Unspecified vitamin D deficiency Is taking vitamin  D 2000 units daily   Depression Is stable is taking zoloft 50 mg daily   CVA (cerebral vascular accident) Is without change in status is taking asa 325 mg daily   GERD (gastroesophageal reflux disease) Is stable is taking zantac 150 mg daily   Vascular dementia Is without change in status is taking aricept 10 mg daily takes namenda 10 mg twice daily    Past Medical History  Diagnosis Date  . Diabetes mellitus   . Herpes simplex   . Hyperlipidemia   . Urinary tract infection   . Altered mental status   . Esophageal reflux   . Congenital coronary artery anomaly   . Cerebrovascular disease   . Depression   . Constipation   . Dementia   . Generalized abdominal pain     No past surgical history on file.  VITAL SIGNS BP 128/76  Pulse 70  Ht 5\' 5"  (1.651 m)  Wt 184 lb (83.462 kg)  BMI 30.62 kg/m2   Patient's Medications  New Prescriptions   No medications on file  Previous Medications   AMLODIPINE (NORVASC) 5 MG TABLET    Take 5 mg by mouth daily.   ASPIRIN 325 MG TABLET    Take 325 mg by mouth daily.   ATORVASTATIN (LIPITOR) 40 MG TABLET    Take 40 mg by mouth daily.   CHOLECALCIFEROL (VITAMIN D) 1000 UNITS TABLET    Take 2,000 Units by mouth daily.   DONEPEZIL (ARICEPT) 10 MG TABLET    Take 10 mg by mouth at bedtime.   INSULIN ASPART (NOVOLOG) 100 UNIT/ML INJECTION    Inject 5 Units into the skin 3 (three) times daily before meals. Only takes 5 units if cbg >150   INSULIN GLARGINE  (LANTUS) 100 UNIT/ML INJECTION    Inject 20 Units into the skin at bedtime.   MEMANTINE (NAMENDA) 10 MG TABLET    Take 10 mg by mouth 2 (two) times daily.   POLYETHYL GLYCOL-PROPYL GLYCOL (SYSTANE) 0.4-0.3 % SOLN    Apply 1 drop to eye 3 (three) times daily.   RANITIDINE (ZANTAC) 75 MG TABLET    Take 150 mg by mouth at bedtime.   SERTRALINE (ZOLOFT) 50 MG TABLET    Take 50 mg by mouth daily.  Modified Medications   No medications on file  Discontinued Medications   METFORMIN (GLUCOPHAGE) 500 MG TABLET    Take 500 mg by mouth 2 (two) times daily with a meal.   ROSUVASTATIN (CRESTOR) 10 MG TABLET    Take 10 mg by mouth daily.    SIGNIFICANT DIAGNOSTIC EXAMS  05-04-11: mammogram: stable masses left breast unchanged probable benign findings  04-03-12: wbc 4.7; hgb 11.1; hct 33.5 ;mcv 82.5; plt 209; glucose 112; bun 19; creat 0.76; k+ 4.3 Na++ 140; liver normal albumin 3.7; chol 125; ldl 64; trig 61; hgb a1c 9.5    Review of Systems  Unable to perform ROS  Physical Exam  Constitutional: She appears well-developed and well-nourished.  overweight  Neck: Neck supple.  Cardiovascular: Normal rate, regular rhythm and intact distal pulses.   Respiratory: Effort normal and breath sounds normal.  GI: Bowel sounds are normal.  Musculoskeletal:  Able to move extremities  Neurological: She is alert.  Oriented to self  Skin: Skin is warm and dry.  Psychiatric: She has a normal mood and affect.      ASSESSMENT/ PLAN:  Will continue current regimen and will continue to monitor her status; will make changes in the future as indicated FUTURE ORDERS:  Will check hgb a1c next blood draw

## 2012-07-02 NOTE — Assessment & Plan Note (Signed)
Is stable is taking zantac 150 mg daily  

## 2012-07-02 NOTE — Assessment & Plan Note (Signed)
Is taking vitamin  D 2000 units daily

## 2012-07-02 NOTE — Assessment & Plan Note (Signed)
Is stable is taking zoloft 50 mg daily

## 2012-07-03 ENCOUNTER — Other Ambulatory Visit: Payer: Self-pay | Admitting: Internal Medicine

## 2012-07-03 DIAGNOSIS — R921 Mammographic calcification found on diagnostic imaging of breast: Secondary | ICD-10-CM

## 2012-07-15 ENCOUNTER — Ambulatory Visit
Admission: RE | Admit: 2012-07-15 | Discharge: 2012-07-15 | Disposition: A | Discharge status: Home / Self Care | Payer: Medicare Other | Source: Ambulatory Visit | Attending: Internal Medicine | Admitting: Internal Medicine

## 2012-07-15 DIAGNOSIS — R921 Mammographic calcification found on diagnostic imaging of breast: Secondary | ICD-10-CM

## 2012-08-07 ENCOUNTER — Non-Acute Institutional Stay (SKILLED_NURSING_FACILITY): Payer: Medicare Other | Admitting: Internal Medicine

## 2012-08-07 DIAGNOSIS — F0150 Vascular dementia without behavioral disturbance: Secondary | ICD-10-CM

## 2012-08-07 DIAGNOSIS — K219 Gastro-esophageal reflux disease without esophagitis: Secondary | ICD-10-CM

## 2012-08-07 DIAGNOSIS — IMO0002 Reserved for concepts with insufficient information to code with codable children: Secondary | ICD-10-CM

## 2012-08-07 DIAGNOSIS — I10 Essential (primary) hypertension: Secondary | ICD-10-CM

## 2012-08-07 DIAGNOSIS — E559 Vitamin D deficiency, unspecified: Secondary | ICD-10-CM

## 2012-08-07 DIAGNOSIS — E1165 Type 2 diabetes mellitus with hyperglycemia: Secondary | ICD-10-CM

## 2012-08-07 NOTE — Progress Notes (Signed)
Patient ID: [name] [name], female   DOB: 06-08-1940, 72 y.o.   MRN: [id number] Pattye Meda L. Renato Gails, D.O., C.M.D. [facility] (long term care)  Chief Complaint: med mgt chronic diseases HPI: 72 yo female with h/o Alzheimer's disease, DMII on insulin, hyperlipidemia, depression and gerd seen for regular visit.  She had no complaints.  She is very active attending activities.  She is often seen with her blanket up over her head.    Review of Systems:  Review of Systems  Constitutional: Negative for fever.  HENT: Negative for congestion.   Eyes: Negative for blurred vision.  Respiratory: Negative for shortness of breath.   Cardiovascular: Negative for chest pain.  Gastrointestinal: Negative for abdominal pain.  Genitourinary: Negative for dysuria.  Musculoskeletal: Negative for falls.  Skin: Negative for rash.  Neurological: Negative for dizziness.  Psychiatric/Behavioral: Positive for depression and memory loss.     Medications: Patient's Medications  New Prescriptions   No medications on file  Previous Medications   AMLODIPINE (NORVASC) 5 MG TABLET    Take 5 mg by mouth daily.   ASPIRIN 325 MG TABLET    Take 325 mg by mouth daily.   ATORVASTATIN (LIPITOR) 40 MG TABLET    Take 40 mg by mouth daily.   CHOLECALCIFEROL (VITAMIN D) 1000 UNITS TABLET    Take 2,000 Units by mouth daily.   DONEPEZIL (ARICEPT) 10 MG TABLET    Take 10 mg by mouth at bedtime.   INSULIN ASPART (NOVOLOG) 100 UNIT/ML INJECTION    Inject 5 Units into the skin 3 (three) times daily before meals. Only takes 5 units if cbg >150   INSULIN GLARGINE (LANTUS) 100 UNIT/ML INJECTION    Inject 20 Units into the skin at bedtime.   MEMANTINE (NAMENDA) 10 MG TABLET    Take 10 mg by mouth 2 (two) times daily.   POLYETHYL GLYCOL-PROPYL GLYCOL (SYSTANE) 0.4-0.3 % SOLN    Apply 1 drop to eye 3 (three) times daily.   RANITIDINE (ZANTAC) 75 MG TABLET    Take 150 mg by mouth at bedtime.   SERTRALINE (ZOLOFT) 50 MG  TABLET    Take 50 mg by mouth daily.  Modified Medications   No medications on file  Discontinued Medications   No medications on file     Physical Exam: Physical Exam  Nursing note and vitals reviewed. Constitutional: She appears well-developed and well-nourished. No distress.  HENT:  Head: Normocephalic and atraumatic.  Eyes: EOM are normal. Pupils are equal, round, and reactive to light.  Neck: Normal range of motion. Neck supple.  Cardiovascular: Normal rate, regular rhythm, normal heart sounds and intact distal pulses.   Pulmonary/Chest: Effort normal and breath sounds normal. No respiratory distress.  Abdominal: Soft. Bowel sounds are normal. She exhibits no distension. There is no tenderness.  Musculoskeletal: Normal range of motion. She exhibits no edema and no tenderness.  Neurological: She is alert. No cranial nerve deficit.  Skin: Skin is warm and dry.   Labs reviewed: Facility labs and studies reviewed in chart  Assessment/Plan 1. Vascular dementia, without behavioral disturbance Cont aricept and namenda Still participates in activities, not seen ambulating Requires ADL assistance with bathing, dressing, toileting Has some associated depressed mood--continue zoloft  2. Type II or unspecified type diabetes mellitus with unspecified complication, uncontrolled -f/u hba1c q 3-4 mos -cont lantus with novolog meal coverage  3. Essential hypertension, benign -at goal with current therapy  4. Unspecified vitamin D deficiency -continue vitamin D daily  5. GERD (gastroesophageal reflux disease) -continue zantac regularly  Family/ staff Communication: discussed with nurse

## 2012-09-03 ENCOUNTER — Non-Acute Institutional Stay (SKILLED_NURSING_FACILITY): Payer: Medicare Other | Admitting: Adult Health

## 2012-09-03 DIAGNOSIS — F329 Major depressive disorder, single episode, unspecified: Secondary | ICD-10-CM

## 2012-09-03 DIAGNOSIS — I10 Essential (primary) hypertension: Secondary | ICD-10-CM

## 2012-09-03 DIAGNOSIS — K219 Gastro-esophageal reflux disease without esophagitis: Secondary | ICD-10-CM

## 2012-09-03 DIAGNOSIS — E118 Type 2 diabetes mellitus with unspecified complications: Secondary | ICD-10-CM

## 2012-09-03 DIAGNOSIS — E1165 Type 2 diabetes mellitus with hyperglycemia: Secondary | ICD-10-CM

## 2012-09-03 DIAGNOSIS — E785 Hyperlipidemia, unspecified: Secondary | ICD-10-CM

## 2012-09-03 DIAGNOSIS — F0150 Vascular dementia without behavioral disturbance: Secondary | ICD-10-CM

## 2012-09-03 DIAGNOSIS — I639 Cerebral infarction, unspecified: Secondary | ICD-10-CM

## 2012-09-03 DIAGNOSIS — I6350 Cerebral infarction due to unspecified occlusion or stenosis of unspecified cerebral artery: Secondary | ICD-10-CM

## 2012-10-19 ENCOUNTER — Encounter: Payer: Self-pay | Admitting: Internal Medicine

## 2012-11-17 ENCOUNTER — Non-Acute Institutional Stay (SKILLED_NURSING_FACILITY): Payer: Medicare Other | Admitting: Internal Medicine

## 2012-11-17 ENCOUNTER — Encounter: Payer: Self-pay | Admitting: Internal Medicine

## 2012-11-17 DIAGNOSIS — I10 Essential (primary) hypertension: Secondary | ICD-10-CM

## 2012-11-17 DIAGNOSIS — E1165 Type 2 diabetes mellitus with hyperglycemia: Secondary | ICD-10-CM

## 2012-11-17 DIAGNOSIS — F329 Major depressive disorder, single episode, unspecified: Secondary | ICD-10-CM

## 2012-11-17 DIAGNOSIS — F0150 Vascular dementia without behavioral disturbance: Secondary | ICD-10-CM

## 2012-11-17 DIAGNOSIS — F32A Depression, unspecified: Secondary | ICD-10-CM

## 2012-11-17 DIAGNOSIS — F3289 Other specified depressive episodes: Secondary | ICD-10-CM

## 2012-11-17 DIAGNOSIS — IMO0002 Reserved for concepts with insufficient information to code with codable children: Secondary | ICD-10-CM

## 2012-11-17 DIAGNOSIS — K219 Gastro-esophageal reflux disease without esophagitis: Secondary | ICD-10-CM

## 2012-11-17 NOTE — Progress Notes (Signed)
Patient ID: [name] [name], female   DOB: Nov 22, 1940, 72 y.o.   MRN: [id number] Location:    [facility] Provider:  Gwenith Spitz. Renato Gails, D.O., C.M.D.  Code Status:  Full code  Chief Complaint  Patient presents with  . Medical Managment of Chronic Issues    HPI:  72 yo black female here for long term care due to her prior stroke with vascular dementia.  She is diabetic on insulin, has hypertension, hyperlipidemia, GERD and depression.  When seen today, her mood was very good.  She had no complaints.    Review of Systems:  Review of Systems  Constitutional: Negative for fever.  HENT: Negative for hearing loss.   Eyes: Negative for blurred vision.  Respiratory: Negative for cough and shortness of breath.   Cardiovascular: Negative for chest pain.  Gastrointestinal: Negative for abdominal pain.  Genitourinary: Negative for dysuria.  Musculoskeletal: Negative for myalgias and falls.  Skin: Negative for rash.  Neurological: Negative for dizziness.  Psychiatric/Behavioral: Positive for memory loss. Negative for depression.    Medications: Patient's Medications  New Prescriptions   No medications on file  Previous Medications   AMLODIPINE (NORVASC) 5 MG TABLET    Take 5 mg by mouth daily.   ASPIRIN 325 MG TABLET    Take 325 mg by mouth daily.   ATORVASTATIN (LIPITOR) 40 MG TABLET    Take 40 mg by mouth daily.   CHOLECALCIFEROL (VITAMIN D) 1000 UNITS TABLET    Take 2,000 Units by mouth daily.   DONEPEZIL (ARICEPT) 10 MG TABLET    Take 10 mg by mouth at bedtime.   INSULIN ASPART (NOVOLOG) 100 UNIT/ML INJECTION    Inject 5 Units into the skin 3 (three) times daily before meals. Only takes 5 units if cbg >150   INSULIN GLARGINE (LANTUS) 100 UNIT/ML INJECTION    Inject 20 Units into the skin at bedtime.   MEMANTINE (NAMENDA) 10 MG TABLET    Take 10 mg by mouth 2 (two) times daily.   POLYETHYL GLYCOL-PROPYL GLYCOL (SYSTANE) 0.4-0.3 % SOLN    Apply 1 drop to eye 3 (three) times  daily.   RANITIDINE (ZANTAC) 75 MG TABLET    Take 150 mg by mouth at bedtime.   SERTRALINE (ZOLOFT) 50 MG TABLET    Take 50 mg by mouth daily.  Modified Medications   No medications on file  Discontinued Medications   No medications on file    Physical Exam: Filed Vitals:   11/17/12 1446  BP: 128/68  Pulse: 72  Temp: 99 F (37.2 C)  Resp: 20  Physical Exam  Constitutional: She appears well-developed and well-nourished. No distress.  Black female seated in a wheelchair  HENT:  Head: Normocephalic and atraumatic.  Eyes: EOM are normal. Pupils are equal, round, and reactive to light.  Cardiovascular: Normal rate, regular rhythm, normal heart sounds and intact distal pulses.   Pulmonary/Chest: Effort normal and breath sounds normal. No respiratory distress.  Abdominal: Soft. Bowel sounds are normal. She exhibits no distension. There is no tenderness.  Musculoskeletal: Normal range of motion.  Neurological: She is alert.  Oriented only to time  Skin: Skin is warm and dry.  Psychiatric: She has a normal mood and affect.    Labs reviewed: 1/14:  Lipids at goal with lipitor 4/14:  hba1c 8.1  Assessment/Plan 1. Vascular dementia, without behavioral disturbance -stable at this time, mood is good today -continues on aricept and namenda  2. Essential hypertension, benign -continue  current therapy, at goal  3. Type II or unspecified type diabetes mellitus with unspecified complication, uncontrolled -cont lantus 20 units at bedtime with novolog 5 units at meals for cbg >150  4. Depression -mood much improved today, cont low dose zoloft  5. GERD (gastroesophageal reflux disease) -no complaints, continue bedtime ranitidine   Family/ staff Communication: discussed with nursing staff Goals of care: full code Labs/tests ordered:  Check hba1c, bmp

## 2012-12-20 ENCOUNTER — Non-Acute Institutional Stay (SKILLED_NURSING_FACILITY): Payer: Medicare Other | Admitting: Nurse Practitioner

## 2012-12-20 ENCOUNTER — Encounter: Payer: Self-pay | Admitting: Nurse Practitioner

## 2012-12-20 DIAGNOSIS — E1165 Type 2 diabetes mellitus with hyperglycemia: Secondary | ICD-10-CM

## 2012-12-20 DIAGNOSIS — I639 Cerebral infarction, unspecified: Secondary | ICD-10-CM

## 2012-12-20 DIAGNOSIS — F0150 Vascular dementia without behavioral disturbance: Secondary | ICD-10-CM

## 2012-12-20 DIAGNOSIS — I6350 Cerebral infarction due to unspecified occlusion or stenosis of unspecified cerebral artery: Secondary | ICD-10-CM

## 2012-12-20 DIAGNOSIS — F329 Major depressive disorder, single episode, unspecified: Secondary | ICD-10-CM

## 2012-12-20 DIAGNOSIS — I10 Essential (primary) hypertension: Secondary | ICD-10-CM

## 2012-12-20 NOTE — Progress Notes (Signed)
Patient ID: [name] [name], female   DOB: 12-Nov-1940, 72 y.o.   MRN: [id number]   PCP: Bufford Spikes, DO   No Known Allergies  Chief Complaint  Patient presents with  . Medical Managment of Chronic Issues    HPI:  72 yo black female here for long term care due to her prior stroke with vascular dementia. She is diabetic on insulin, has hypertension, hyperlipidemia, GERD and depression. Pt is being seen today for routine follow up. Pt is doing well in current setting and without any complaints at this time; nursing does not have any concerns  Review of Systems:  Review of Systems  Constitutional: Negative for fever.  HENT: Negative for hearing loss.   Eyes: Negative for blurred vision.  Respiratory: Negative for cough and shortness of breath.   Cardiovascular: Negative for chest pain.  Gastrointestinal: Negative for abdominal pain.  Genitourinary: Negative for dysuria.  Musculoskeletal: Negative for falls and myalgias.  Skin: Negative for rash.  Neurological: Negative for dizziness.  Psychiatric/Behavioral: Positive for memory loss. Negative for depression.     Past Medical History  Diagnosis Date  . Diabetes mellitus   . Herpes simplex   . Hyperlipidemia   . Urinary tract infection   . Altered mental status   . Esophageal reflux   . Congenital coronary artery anomaly   . Cerebrovascular disease   . Depression   . Constipation   . Dementia   . Generalized abdominal pain    No past surgical history on file. Social History:   reports that she has quit smoking. She does not have any smokeless tobacco history on file. She reports that she does not drink alcohol or use illicit drugs.  No family history on file.  Medications: Patient's Medications  New Prescriptions   No medications on file  Previous Medications   AMLODIPINE (NORVASC) 5 MG TABLET    Take 5 mg by mouth daily.   ASPIRIN 325 MG TABLET    Take 325 mg by mouth daily.   ATORVASTATIN (LIPITOR) 40 MG  TABLET    Take 40 mg by mouth daily.   CHOLECALCIFEROL (VITAMIN D) 1000 UNITS TABLET    Take 2,000 Units by mouth daily.   DONEPEZIL (ARICEPT) 10 MG TABLET    Take 10 mg by mouth at bedtime.   INSULIN ASPART (NOVOLOG) 100 UNIT/ML INJECTION    Inject 5 Units into the skin 3 (three) times daily before meals. Only takes 5 units if cbg >150   INSULIN GLARGINE (LANTUS) 100 UNIT/ML INJECTION    Inject 20 Units into the skin at bedtime.   MEMANTINE (NAMENDA) 10 MG TABLET    Take 10 mg by mouth 2 (two) times daily.   POLYETHYL GLYCOL-PROPYL GLYCOL (SYSTANE) 0.4-0.3 % SOLN    Apply 1 drop to eye 3 (three) times daily.   RANITIDINE (ZANTAC) 75 MG TABLET    Take 150 mg by mouth at bedtime.   SERTRALINE (ZOLOFT) 50 MG TABLET    Take 50 mg by mouth daily.  Modified Medications   No medications on file  Discontinued Medications   No medications on file     Physical Exam: Physical Exam  Vitals reviewed. Constitutional: She is well-developed, well-nourished, and in no distress. No distress.  HENT:  Head: Normocephalic and atraumatic.  Eyes: Conjunctivae and EOM are normal. Pupils are equal, round, and reactive to light.  Neck: Normal range of motion. Neck supple. No thyromegaly present.  Cardiovascular: Normal rate, regular rhythm and normal  heart sounds.   Pulmonary/Chest: Effort normal and breath sounds normal.  Abdominal: Soft. Bowel sounds are normal.  Musculoskeletal: She exhibits no edema and no tenderness.  Lymphadenopathy:    She has no cervical adenopathy.  Neurological: She is alert.  Skin: Skin is warm and dry. She is not diaphoretic.  Psychiatric: Affect normal.    Filed Vitals:   12/20/12 1444  BP: 105/43  Pulse: 83  Temp: 97.5 F (36.4 C)  Resp: 19      Labs reviewed: CMP with Estimated GFR    Result: 04/03/2012 11:13 AM   ( Status: F )     C Sodium 140     135-145 mEq/L SLN   Potassium 4.3     3.5-5.3 mEq/L SLN   Chloride 106     96-112 mEq/L SLN   CO2 29      19-32 mEq/L SLN   Glucose 112   H 70-99 mg/dL SLN   BUN 19     1-61 mg/dL SLN   Creatinine 0.96     0.50-1.10 mg/dL SLN   Bilirubin, Total 0.4     0.3-1.2 mg/dL SLN   Alkaline Phosphatase 74     39-117 U/L SLN   AST/SGOT 23     0-37 U/L SLN   ALT/SGPT 16     0-35 U/L SLN   Total Protein 6.6     6.0-8.3 g/dL SLN   Albumin 3.7     0.4-5.4 g/dL SLN   Calcium 9.6     0.9-81.1 mg/dL SLN   Est GFR, African American >89      mL/min SLN   Est GFR, NonAfrican American 80      mL/min SLN C CBC NO Diff (Complete Blood Count)    Result: 04/03/2012 10:48 AM   ( Status: F )       WBC 4.7     4.0-10.5 K/uL SLN   RBC 4.06     3.87-5.11 MIL/uL SLN   Hemoglobin 11.1   L 12.0-15.0 g/dL SLN   Hematocrit 91.4   L 36.0-46.0 % SLN   MCV 82.5     78.0-100.0 fL SLN   MCH 27.3     26.0-34.0 pg SLN   MCHC 33.1     30.0-36.0 g/dL SLN   RDW 78.2     95.6-21.3 % SLN   Platelet Count 209     150-400 K/uL SLN   Lipid Profile    Result: 04/03/2012 11:12 AM   ( Status: F )       Cholesterol 125     0-200 mg/dL SLN C Triglyceride 61     <150 mg/dL SLN   HDL Cholesterol 49     >39 mg/dL SLN   Total Chol/HDL Ratio 2.6      Ratio SLN   VLDL Cholesterol (Calc) 12     0-40 mg/dL SLN   LDL Cholesterol (Calc) 64     0-99 mg/dL SLN C Hemoglobin Y8M    Result: 04/03/2012 12:17 PM   ( Status: F )       Hemoglobin A1C 9.5   H <5.7 % SLN C Estimated Average Glucose 226   11/18/2012:  Sodium 142, potassium 3.9, glucose 99, BUN 16, Cr 0.87 AST 16 ALT 9 AIC7.6 Assessment/Plan 1. Essential hypertension, benign Patient is stable; continue current regimen. Will monitor and make changes as necessary.  2. CVA (cerebral vascular accident) Stable remains on ASA 325 mg daily  3. Type II or unspecified  type diabetes mellitus with unspecified complication, uncontrolled Blood sugars are stable; AIC  Stable for 72 and co-morbities   4. Vascular dementia Stable in current setting; will cont current medications  5.  Depression Mood has been stable; will cont current medications

## 2013-01-22 NOTE — Progress Notes (Signed)
Patient ID: [name] [name], female   DOB: [birth year], 72 y.o.   MRN: [id number]  [facility]  No Known Allergies  Chief Complaint  Patient presents with  . Medical Managment of Chronic Issues    HPI  She is being seen for the management of her chronic illnesses. She has remained stable. There are no concerns being voiced by the nursing staff at this time. She is unable to fully participate in the hpi or ros.   Past Medical History  Diagnosis Date  . Diabetes mellitus   . Herpes simplex   . Hyperlipidemia   . Urinary tract infection   . Altered mental status   . Esophageal reflux   . Congenital coronary artery anomaly   . Cerebrovascular disease   . Depression   . Constipation   . Dementia   . Generalized abdominal pain     No past surgical history on file.  Filed Vitals:   09/03/12 1422  BP: 126/58  Pulse: 68  Height: 5\' 5"  (1.651 m)  Weight: 180 lb (81.647 kg)   MEDICATIONS  Norvasc 5 mg daily Asa 325 mg daily lantus 20 units daily lipitor 40 mg daily namenda 10 mg twice daily novolog 5 units prior to meals for cbg >=150 remeron 7.5 mg nightly for appetite systane to both eyes three times daily Vit d 2000 units daily Zantac 150 mg twice daily zoloft 50 mg daily lantus 20 units nightly       SIGNIFICANT DIAGNOSTIC EXAMS  05-04-11: mammogram: stable masses left breast unchanged probable benign findings  LABS REVIEWED:   04-03-12: wbc 4.7; hgb 11.1; hct 33.5 ;mcv 82.5; plt 209; glucose 112; bun 19; creat 0.76; k+ 4.3 Na++ 140; liver normal albumin 3.7; chol 125; ldl 64; trig 61; hgb a1c 9.5  07-03-12: hgb a1c 8.1    Review of Systems  Unable to perform ROS  Physical Exam  Constitutional: She appears well-developed and well-nourished.  overweight  Neck: Neck supple.  Cardiovascular: Normal rate, regular rhythm and intact distal pulses.   Respiratory: Effort normal and breath sounds normal.  GI: Bowel sounds are normal.  Musculoskeletal:  Able  to move extremities  Neurological: She is alert.  Oriented to self  Skin: Skin is warm and dry.  Psychiatric: She has a normal mood and affect.      ASSESSMENT/ PLAN:  1. Hypertension: will continue her norvasc 5 mg daily; she is well managed and will monitor her status  2. Diabetes: she is stable will continue her novolog 5 units prior to meals for cbg >=150 and lantus 20 units nightly   3. Dementia: no significant change in her status will continue namenda 10 mg twice daily  And aricept 10 mg daily and will monitor will stop the remeron as her weight is stable   4. Dyslipidemia; will continue lipitor 40 mg daily  5. Gerd: will continue zantac 150 mg twice daily  6. Depression: is emotionally stable will continue zoloft 50 mg daily  7. Cva: is without change in status will continue asa 325 mg daily and will monitor   Will check cbc; cmp and lipids next draw

## 2013-02-03 ENCOUNTER — Non-Acute Institutional Stay (SKILLED_NURSING_FACILITY): Payer: Medicare Other | Admitting: Nurse Practitioner

## 2013-02-03 ENCOUNTER — Encounter: Payer: Self-pay | Admitting: Nurse Practitioner

## 2013-02-03 DIAGNOSIS — F0150 Vascular dementia without behavioral disturbance: Secondary | ICD-10-CM | POA: Insufficient documentation

## 2013-02-03 DIAGNOSIS — I10 Essential (primary) hypertension: Secondary | ICD-10-CM

## 2013-02-03 DIAGNOSIS — I639 Cerebral infarction, unspecified: Secondary | ICD-10-CM

## 2013-02-03 DIAGNOSIS — E1165 Type 2 diabetes mellitus with hyperglycemia: Secondary | ICD-10-CM

## 2013-02-03 DIAGNOSIS — I6350 Cerebral infarction due to unspecified occlusion or stenosis of unspecified cerebral artery: Secondary | ICD-10-CM

## 2013-02-03 DIAGNOSIS — K219 Gastro-esophageal reflux disease without esophagitis: Secondary | ICD-10-CM

## 2013-02-03 DIAGNOSIS — F329 Major depressive disorder, single episode, unspecified: Secondary | ICD-10-CM

## 2013-02-03 NOTE — Progress Notes (Signed)
Patient ID: [name] [name], female   DOB: 12-12-1940, 72 y.o.   MRN: [id number]    Nursing Home Location:  [facility]   Place of Service: SNF (31)  PCP: REED, TIFFANY, DO  No Known Allergies  Chief Complaint  Patient presents with  . Medical Managment of Chronic Issues    HPI:  72 yo black female here for long term care due to her prior stroke with vascular dementia. She is diabetic on insulin, has hypertension, hyperlipidemia, GERD and depression. Pt is being seen today for routine follow up. Pt is doing well in current setting and without any complaints at this time; no changes in the last month; nursing does not have any concerns  Review of Systems:  .Review of Systems  Constitutional: Negative for fever.  Respiratory: Negative for cough and shortness of breath.   Cardiovascular: Negative for chest pain.  Gastrointestinal: Negative for heartburn, abdominal pain and constipation.  Genitourinary: Negative for dysuria.  Musculoskeletal: Negative for back pain, falls, joint pain and myalgias.  Skin: Negative for itching and rash.  Neurological: Negative for dizziness and headaches.  Psychiatric/Behavioral: Positive for memory loss. Negative for depression. The patient does not have insomnia.      Past Medical History  Diagnosis Date  . Diabetes mellitus   . Herpes simplex   . Hyperlipidemia   . Urinary tract infection   . Altered mental status   . Esophageal reflux   . Congenital coronary artery anomaly   . Cerebrovascular disease   . Depression   . Constipation   . Dementia   . Generalized abdominal pain    No past surgical history on file. Social History:   reports that she has quit smoking. She does not have any smokeless tobacco history on file. She reports that she does not drink alcohol or use illicit drugs.  No family history on file.  Medications: Patient's Medications  New Prescriptions   No medications on file  Previous  Medications   AMLODIPINE (NORVASC) 5 MG TABLET    Take 5 mg by mouth daily.   ASPIRIN 325 MG TABLET    Take 325 mg by mouth daily.   ATORVASTATIN (LIPITOR) 40 MG TABLET    Take 40 mg by mouth daily.   CHOLECALCIFEROL (VITAMIN D) 1000 UNITS TABLET    Take 2,000 Units by mouth daily.   DONEPEZIL (ARICEPT) 10 MG TABLET    Take 10 mg by mouth at bedtime.   INSULIN ASPART (NOVOLOG) 100 UNIT/ML INJECTION    Inject 5 Units into the skin 3 (three) times daily before meals. Only takes 5 units if cbg >150   INSULIN GLARGINE (LANTUS) 100 UNIT/ML INJECTION    Inject 20 Units into the skin at bedtime.   MEMANTINE (NAMENDA) 10 MG TABLET    Take 10 mg by mouth 2 (two) times daily.   POLYETHYL GLYCOL-PROPYL GLYCOL (SYSTANE) 0.4-0.3 % SOLN    Apply 1 drop to eye 3 (three) times daily.   RANITIDINE (ZANTAC) 75 MG TABLET    Take 150 mg by mouth at bedtime.   SERTRALINE (ZOLOFT) 50 MG TABLET    Take 50 mg by mouth daily.  Modified Medications   No medications on file  Discontinued Medications   No medications on file     Physical Exam:  Filed Vitals:   02/03/13 1700  BP: 119/74  Pulse: 70  Temp: 96 F (35.6 C)  Resp: 18    Physical Exam  Constitutional: She  is well-developed, well-nourished, and in no distress. No distress.  HENT:  Head: Normocephalic and atraumatic.  Eyes: Conjunctivae and EOM are normal. Pupils are equal, round, and reactive to light.  Neck: Normal range of motion. Neck supple. No thyromegaly present.  Cardiovascular: Normal rate, regular rhythm and normal heart sounds.  Pulmonary/Chest: Effort normal and breath sounds normal.  Abdominal: Soft. Bowel sounds are normal.  Musculoskeletal: She exhibits no edema and no tenderness.  Lymphadenopathy:  She has no cervical adenopathy.  Neurological: She is alert.  Skin: Skin is warm and dry. She is not diaphoretic.  Psychiatric: Affect normal.     Labs reviewed: CMP with Estimated GFR  Result: 04/03/2012 11:13 AM ( Status: F )  C  Sodium 140 135-145 mEq/L SLN  Potassium 4.3 3.5-5.3 mEq/L SLN  Chloride 106 96-112 mEq/L SLN  CO2 29 19-32 mEq/L SLN  Glucose 112 H 70-99 mg/dL SLN  BUN 19 4-09 mg/dL SLN  Creatinine 8.11 9.14-7.82 mg/dL SLN  Bilirubin, Total 0.4 0.3-1.2 mg/dL SLN  Alkaline Phosphatase 74 39-117 U/L SLN  AST/SGOT 23 0-37 U/L SLN  ALT/SGPT 16 0-35 U/L SLN  Total Protein 6.6 6.0-8.3 g/dL SLN  Albumin 3.7 9.5-6.2 g/dL SLN  Calcium 9.6 1.3-08.6 mg/dL SLN  Est GFR, African American >89 mL/min SLN  Est GFR, NonAfrican American 80 mL/min SLN C  CBC NO Diff (Complete Blood Count)  Result: 04/03/2012 10:48 AM ( Status: F )  WBC 4.7 4.0-10.5 K/uL SLN  RBC 4.06 3.87-5.11 MIL/uL SLN  Hemoglobin 11.1 L 12.0-15.0 g/dL SLN  Hematocrit 57.8 L 36.0-46.0 % SLN  MCV 82.5 78.0-100.0 fL SLN  MCH 27.3 26.0-34.0 pg SLN  MCHC 33.1 30.0-36.0 g/dL SLN  RDW 46.9 62.9-52.8 % SLN  Platelet Count 209 150-400 K/uL SLN  Lipid Profile  Result: 04/03/2012 11:12 AM ( Status: F )  Cholesterol 125 0-200 mg/dL SLN C  Triglyceride 61 <150 mg/dL SLN  HDL Cholesterol 49 >39 mg/dL SLN  Total Chol/HDL Ratio 2.6 Ratio SLN  VLDL Cholesterol (Calc) 12 4-13 mg/dL SLN  LDL Cholesterol (Calc) 64 2-44 mg/dL SLN C  Hemoglobin W1U  Result: 04/03/2012 12:17 PM ( Status: F )  Hemoglobin A1C 9.5 H <5.7 % SLN C  Estimated Average Glucose 226  11/18/2012:  Sodium 142, potassium 3.9, glucose 99, BUN 16, Cr 0.87  AST 16  ALT 9  AIC7.6  Assessment/Plan 1. Essential hypertension, benign -Patient is stable; continue current regimen. Will monitor and make changes as necessary.  2. CVA (cerebral vascular accident) -stable; conts on asa and lipitor  3. GERD (gastroesophageal reflux disease) -stable without complaints at this time  4. Type II or unspecified type diabetes mellitus with unspecified complication, uncontrolled -remains stable; last A1c of 7.6 in Sept 2014  5. Vascular dementia -stable on Aricept and namanda  -Will change to  namenda XR at this time  6. Depression -stable on current medications

## 2013-03-12 ENCOUNTER — Non-Acute Institutional Stay (SKILLED_NURSING_FACILITY): Payer: Medicare Other | Admitting: Nurse Practitioner

## 2013-03-12 DIAGNOSIS — E1165 Type 2 diabetes mellitus with hyperglycemia: Secondary | ICD-10-CM

## 2013-03-12 DIAGNOSIS — K219 Gastro-esophageal reflux disease without esophagitis: Secondary | ICD-10-CM

## 2013-03-12 DIAGNOSIS — I10 Essential (primary) hypertension: Secondary | ICD-10-CM

## 2013-03-12 DIAGNOSIS — I639 Cerebral infarction, unspecified: Secondary | ICD-10-CM

## 2013-03-12 DIAGNOSIS — I6350 Cerebral infarction due to unspecified occlusion or stenosis of unspecified cerebral artery: Secondary | ICD-10-CM

## 2013-03-12 DIAGNOSIS — F0150 Vascular dementia without behavioral disturbance: Secondary | ICD-10-CM

## 2013-03-12 DIAGNOSIS — F329 Major depressive disorder, single episode, unspecified: Secondary | ICD-10-CM

## 2013-03-12 DIAGNOSIS — E559 Vitamin D deficiency, unspecified: Secondary | ICD-10-CM

## 2013-03-12 NOTE — Progress Notes (Signed)
Patient ID: [name] [name], female   DOB: [birth year], 72 y.o.   MRN: [id number]    Nursing Home Location:  [facility]   Place of Service: SNF (31)  PCP: REED, TIFFANY, DO  No Known Allergies  Chief Complaint  Patient presents with  . Medical Managment of Chronic Issues    HPI:  72 yo black female at [facility] for long term care due to her prior stroke with vascular dementia. She is diabetic on insulin, has hypertension, hyperlipidemia, GERD and depression. Pt is being seen today for routine follow up on chronic conditions. Pt is doing well in current setting and without any complaints at this time; no changes in the last month; nursing does not have any concerns   Review of Systems:  Review of Systems  Constitutional: Negative for fever and chills.  Respiratory: Negative for cough and shortness of breath.   Cardiovascular: Negative for chest pain.  Gastrointestinal: Negative for heartburn, abdominal pain and constipation.  Genitourinary: Negative for dysuria.  Musculoskeletal: Negative for back pain, falls, joint pain and myalgias.  Skin: Negative.   Neurological: Negative for dizziness and headaches.  Psychiatric/Behavioral: Positive for memory loss. Negative for depression. The patient does not have insomnia.      Past Medical History  Diagnosis Date  . Diabetes mellitus   . Herpes simplex   . Hyperlipidemia   . Urinary tract infection   . Altered mental status   . Esophageal reflux   . Congenital coronary artery anomaly   . Cerebrovascular disease   . Depression   . Constipation   . Dementia   . Generalized abdominal pain    No past surgical history on file. Social History:   reports that she has quit smoking. She does not have any smokeless tobacco history on file. She reports that she does not drink alcohol or use illicit drugs.  No family history on file.  Medications: Patient's Medications  New Prescriptions   No medications on file   Previous Medications   AMLODIPINE (NORVASC) 5 MG TABLET    Take 5 mg by mouth daily.   ASPIRIN 325 MG TABLET    Take 325 mg by mouth daily.   ATORVASTATIN (LIPITOR) 40 MG TABLET    Take 40 mg by mouth daily.   CHOLECALCIFEROL (VITAMIN D) 1000 UNITS TABLET    Take 2,000 Units by mouth daily.   DONEPEZIL (ARICEPT) 10 MG TABLET    Take 10 mg by mouth at bedtime.   INSULIN ASPART (NOVOLOG) 100 UNIT/ML INJECTION    Inject 5 Units into the skin 3 (three) times daily before meals. Only takes 5 units if cbg >150   INSULIN GLARGINE (LANTUS) 100 UNIT/ML INJECTION    Inject 20 Units into the skin at bedtime.   MEMANTINE (NAMENDA) 10 MG TABLET    Take 10 mg by mouth 2 (two) times daily.   POLYETHYL GLYCOL-PROPYL GLYCOL (SYSTANE) 0.4-0.3 % SOLN    Apply 1 drop to eye 3 (three) times daily.   RANITIDINE (ZANTAC) 75 MG TABLET    Take 150 mg by mouth at bedtime.   SERTRALINE (ZOLOFT) 50 MG TABLET    Take 50 mg by mouth daily.  Modified Medications   No medications on file  Discontinued Medications   No medications on file     Physical Exam: Physical Exam  Vitals reviewed. Constitutional: She is well-developed, well-nourished, and in no distress. No distress.  HENT:  Head: Normocephalic and atraumatic.  Eyes: Conjunctivae  and EOM are normal. Pupils are equal, round, and reactive to light.  Neck: Normal range of motion. Neck supple. No thyromegaly present.  Cardiovascular: Normal rate, regular rhythm and normal heart sounds.   Pulmonary/Chest: Effort normal and breath sounds normal.  Abdominal: Soft. Bowel sounds are normal.  Musculoskeletal: She exhibits no edema and no tenderness.  Lymphadenopathy:    She has no cervical adenopathy.  Neurological: She is alert.  Skin: Skin is warm and dry. She is not diaphoretic.  Psychiatric: Affect normal.    Filed Vitals:   03/12/13 1112  BP: 152/79  Pulse: 75  Temp: 98.9 F (37.2 C)  Resp: 20      Labs reviewed: CMP with Estimated GFR    Result: 04/03/2012 11:13 AM ( Status: F ) C  Sodium 140 135-145 mEq/L SLN  Potassium 4.3 3.5-5.3 mEq/L SLN  Chloride 106 96-112 mEq/L SLN  CO2 29 19-32 mEq/L SLN  Glucose 112 H 70-99 mg/dL SLN  BUN 19 6-57 mg/dL SLN  Creatinine 8.46 9.62-9.52 mg/dL SLN  Bilirubin, Total 0.4 0.3-1.2 mg/dL SLN  Alkaline Phosphatase 74 39-117 U/L SLN  AST/SGOT 23 0-37 U/L SLN  ALT/SGPT 16 0-35 U/L SLN  Total Protein 6.6 6.0-8.3 g/dL SLN  Albumin 3.7 8.4-1.3 g/dL SLN  Calcium 9.6 2.4-40.1 mg/dL SLN  Est GFR, African American >89 mL/min SLN  Est GFR, NonAfrican American 80 mL/min SLN C  CBC NO Diff (Complete Blood Count)  Result: 04/03/2012 10:48 AM ( Status: F )  WBC 4.7 4.0-10.5 K/uL SLN  RBC 4.06 3.87-5.11 MIL/uL SLN  Hemoglobin 11.1 L 12.0-15.0 g/dL SLN  Hematocrit 02.7 L 36.0-46.0 % SLN  MCV 82.5 78.0-100.0 fL SLN  MCH 27.3 26.0-34.0 pg SLN  MCHC 33.1 30.0-36.0 g/dL SLN  RDW 25.3 66.4-40.3 % SLN  Platelet Count 209 150-400 K/uL SLN  Lipid Profile  Result: 04/03/2012 11:12 AM ( Status: F )  Cholesterol 125 0-200 mg/dL SLN C  Triglyceride 61 <150 mg/dL SLN  HDL Cholesterol 49 >39 mg/dL SLN  Total Chol/HDL Ratio 2.6 Ratio SLN  VLDL Cholesterol (Calc) 12 4-74 mg/dL SLN  LDL Cholesterol (Calc) 64 2-59 mg/dL SLN C  Hemoglobin D6L  Result: 04/03/2012 12:17 PM ( Status: F )  Hemoglobin A1C 9.5 H <5.7 % SLN C  Estimated Average Glucose 226  11/18/2012:  Sodium 142, potassium 3.9, glucose 99, BUN 16, Cr 0.87  AST 16  ALT 9  AIC7.6   Assessment/Plan 1. Essential hypertension, benign -Patient is stable; continue current regimen. Will monitor and make changes as necessary. -will get cmp 2. CVA (cerebral vascular accident) -stable  3. GERD (gastroesophageal reflux disease) -on zantac, no complaints of GERD  4. Dementia, vascular - has remained without change -conts on aricept and namenda  5. Depression -stable on zoloft  6. Unspecified vitamin D deficiency -on vit D 2000 units -will  follow up Vit D level  7. Type II or unspecified type diabetes mellitus with unspecified complication, uncontrolled -blood sugars stable; conts novolog and lantus -will follow up A1c

## 2013-04-22 ENCOUNTER — Non-Acute Institutional Stay (SKILLED_NURSING_FACILITY): Payer: Medicare Other | Admitting: Internal Medicine

## 2013-04-22 ENCOUNTER — Encounter: Payer: Self-pay | Admitting: Internal Medicine

## 2013-04-22 DIAGNOSIS — E1165 Type 2 diabetes mellitus with hyperglycemia: Secondary | ICD-10-CM

## 2013-04-22 DIAGNOSIS — E118 Type 2 diabetes mellitus with unspecified complications: Secondary | ICD-10-CM

## 2013-04-22 DIAGNOSIS — D649 Anemia, unspecified: Secondary | ICD-10-CM | POA: Insufficient documentation

## 2013-04-22 DIAGNOSIS — I639 Cerebral infarction, unspecified: Secondary | ICD-10-CM

## 2013-04-22 DIAGNOSIS — I6350 Cerebral infarction due to unspecified occlusion or stenosis of unspecified cerebral artery: Secondary | ICD-10-CM

## 2013-04-22 DIAGNOSIS — I10 Essential (primary) hypertension: Secondary | ICD-10-CM

## 2013-04-22 DIAGNOSIS — E559 Vitamin D deficiency, unspecified: Secondary | ICD-10-CM

## 2013-04-22 DIAGNOSIS — F0150 Vascular dementia without behavioral disturbance: Secondary | ICD-10-CM

## 2013-04-22 DIAGNOSIS — K219 Gastro-esophageal reflux disease without esophagitis: Secondary | ICD-10-CM

## 2013-04-22 DIAGNOSIS — IMO0002 Reserved for concepts with insufficient information to code with codable children: Secondary | ICD-10-CM

## 2013-04-22 NOTE — Assessment & Plan Note (Signed)
Controlled on Zantac 150 mg BID; will continue

## 2013-04-22 NOTE — Assessment & Plan Note (Signed)
OK on Norvasc 5 mg ;could make a case to go up in dose ;will not at this time

## 2013-04-22 NOTE — Assessment & Plan Note (Addendum)
Continue Namenda and ASA 325

## 2013-04-22 NOTE — Progress Notes (Signed)
MRN: [id number] Name: [name] [name]  Sex: female Age: 73 y.o. DOB: 04/16/1940  [facility] Level Of Care: SNF Provider: Merrilee SeashoreALEXANDER, ANNE D Emergency Contacts: Extended Emergency Contact Information Primary Emergency Contact: Wynn,Blondine Address: [address]          [place] Home Phone: [phone] Work Phone: [phone] Relation: Sister  Code Status: FULL  Allergies: Review of patient's allergies indicates no known allergies.  Chief Complaint  Patient presents with  . Medical Managment of Chronic Issues    HPI: Patient is 73 y.o. female who is being seen for routine problems.  Past Medical History  Diagnosis Date  . Diabetes mellitus   . Herpes simplex   . Hyperlipidemia   . Urinary tract infection   . Altered mental status   . Esophageal reflux   . Congenital coronary artery anomaly   . Cerebrovascular disease   . Depression   . Constipation   . Dementia   . Generalized abdominal pain     History reviewed. No pertinent past surgical history.    Medication List       This list is accurate as of: 04/22/13  4:34 PM.  Always use your most recent med list.               amLODipine 5 MG tablet  Commonly known as:  NORVASC  Take 5 mg by mouth daily.     aspirin 325 MG tablet  Take 325 mg by mouth daily.     atorvastatin 40 MG tablet  Commonly known as:  LIPITOR  Take 40 mg by mouth daily.     cholecalciferol 1000 UNITS tablet  Commonly known as:  VITAMIN D  Take 2,000 Units by mouth daily.     donepezil 10 MG tablet  Commonly known as:  ARICEPT  Take 10 mg by mouth at bedtime.     insulin aspart 100 UNIT/ML injection  Commonly known as:  novoLOG  Inject 5 Units into the skin 3 (three) times daily before meals. Only takes 5 units if cbg >150     insulin glargine 100 UNIT/ML injection  Commonly known as:  LANTUS  Inject 20 Units into the skin at bedtime.     memantine 10 MG tablet   Commonly known as:  NAMENDA  Take 10 mg by mouth 2 (two) times daily.     ranitidine 75 MG tablet  Commonly known as:  ZANTAC  Take 150 mg by mouth at bedtime.     sertraline 50 MG tablet  Commonly known as:  ZOLOFT  Take 50 mg by mouth daily.     SYSTANE 0.4-0.3 % Soln  Generic drug:  Polyethyl Glycol-Propyl Glycol  Apply 1 drop to eye 3 (three) times daily.        No orders of the defined types were placed in this encounter.    Immunization History  Administered Date(s) Administered  . Influenza Whole 12/23/2012  . Pneumococcal Polysaccharide-23 02/18/2009    History  Substance Use Topics  . Smoking status: Former Games developermoker  . Smokeless tobacco: Not on file  . Alcohol Use: No    Review of Systems  DATA OBTAINED: from patient; PT HAS NO C/O GENERAL: Feels well no fevers, fatigue, appetite changes SKIN: No itching, rash HEENT: No complaint RESPIRATORY: No cough, wheezing, SOB CARDIAC: No chest pain, palpitations, lower extremity edema  GI: No abdominal pain, No N/V/D or constipation, No heartburn or reflux  GU:  No dysuria, frequency or urgency, or incontinence  MUSCULOSKELETAL: No unrelieved bone/joint pain NEUROLOGIC: No headache, dizziness or focal weakness PSYCHIATRIC: No overt anxiety or sadness. Sleeps well.   Filed Vitals:   04/22/13 1317  BP: 148/80  Pulse: 90  Temp: 98.6 F (37 C)  Resp: 20    Physical Exam  GENERAL APPEARANCE: Alert, conversant. Appropriately groomed. No acute distress  SKIN: No diaphoresis rash, or wounds HEENT: Unremarkable RESPIRATORY: Breathing is even, unlabored. Lung sounds are clear   CARDIOVASCULAR: Heart RRR no murmurs, rubs or gallops. No peripheral edema  GASTROINTESTINAL: Abdomen is soft, non-tender, not distended w/ normal bowel sounds.  GENITOURINARY: Bladder non tender, not distended  MUSCULOSKELETAL: No abnormal joints or musculature NEUROLOGIC: Cranial nerves 2-12 grossly intact. Moves all extremities no  tremor. PSYCHIATRIC: Mood and affect appropriate to situation, no behavioral issues  Patient Active Problem List   Diagnosis Date Noted  . Anemia 04/22/2013  . Vascular dementia without behavioral disturbance 03/12/2013  . Type II or unspecified type diabetes mellitus with unspecified complication, uncontrolled 07/02/2012  . Essential hypertension, benign 07/02/2012  . Unspecified vitamin D deficiency 07/02/2012  . Depression 07/02/2012  . CVA (cerebral vascular accident) 07/02/2012  . GERD (gastroesophageal reflux disease) 07/02/2012    CBC    Component Value Date/Time   WBC 5.0 08/27/2011 1803   RBC 3.84* 08/27/2011 1803   RBC 3.36* 01/19/2010 1309   HGB 10.7* 08/27/2011 1803   HCT 33.9* 08/27/2011 1803   PLT 188 08/27/2011 1803   MCV 88.3 08/27/2011 1803   LYMPHSABS 1.8 08/27/2011 1803   MONOABS 0.4 08/27/2011 1803   EOSABS 0.1 08/27/2011 1803   BASOSABS 0.0 08/27/2011 1803    CMP     Component Value Date/Time   NA 140 08/27/2011 1803   K 4.2 08/27/2011 1803   CL 103 08/27/2011 1803   CO2 28 08/27/2011 1803   GLUCOSE 148* 08/27/2011 1803   BUN 14 08/27/2011 1803   CREATININE 0.73 08/27/2011 1803   CALCIUM 9.4 08/27/2011 1803   PROT 7.3 01/17/2010 1318   ALBUMIN 3.7 01/17/2010 1318   AST 23 01/17/2010 1318   ALT 16 01/17/2010 1318   ALKPHOS 69 01/17/2010 1318   BILITOT 0.5 01/17/2010 1318   GFRNONAA 85* 08/27/2011 1803   GFRAA >90 08/27/2011 1803    Assessment and Plan  Essential hypertension, benign OK on Norvasc 5 mg ;could make a case to go up in dose ;will not at this time  CVA (cerebral vascular accident) Continue ASA 325 as prophylaxis  GERD (gastroesophageal reflux disease) Controlled on Zantac 150 mg BID; will continue  Type II or unspecified type diabetes mellitus with unspecified complication, uncontrolled :Last HbA1c was 1/2 was 8.1, up from prior of 7.6; could go up just a touch in lantus-will oinc to 23 units qHS  Vascular dementia without behavioral  disturbance Continue Namenda and ASA 325  Unspecified vitamin D deficiency Most recent Vit D was 41.2 on 03/14/2013. Will decrease daily from 2000u to 1000u daily.  Anemia Hb 11.4 on 03/14/2013 stable from a year ago 11.1; MCV 86; pt on no supplements    Margit Hanks, MD

## 2013-04-22 NOTE — Assessment & Plan Note (Signed)
Continue ASA 325 as prophylaxis

## 2013-04-22 NOTE — Assessment & Plan Note (Signed)
Hb 11.4 on 03/14/2013 stable from a year ago 11.1; MCV 86; pt on no supplements

## 2013-04-22 NOTE — Assessment & Plan Note (Signed)
Most recent Vit D was 41.2 on 03/14/2013. Will decrease daily from 2000u to 1000u daily.

## 2013-04-22 NOTE — Assessment & Plan Note (Signed)
:  Last HbA1c was 1/2 was 8.1, up from prior of 7.6; could go up just a touch in lantus-will oinc to 23 units qHS

## 2013-06-09 ENCOUNTER — Encounter: Payer: Self-pay | Admitting: Nurse Practitioner

## 2013-06-09 ENCOUNTER — Non-Acute Institutional Stay (SKILLED_NURSING_FACILITY): Payer: Medicare Other | Admitting: Nurse Practitioner

## 2013-06-09 DIAGNOSIS — I639 Cerebral infarction, unspecified: Secondary | ICD-10-CM

## 2013-06-09 DIAGNOSIS — IMO0002 Reserved for concepts with insufficient information to code with codable children: Secondary | ICD-10-CM

## 2013-06-09 DIAGNOSIS — D649 Anemia, unspecified: Secondary | ICD-10-CM

## 2013-06-09 DIAGNOSIS — F0150 Vascular dementia without behavioral disturbance: Secondary | ICD-10-CM

## 2013-06-09 DIAGNOSIS — E118 Type 2 diabetes mellitus with unspecified complications: Secondary | ICD-10-CM

## 2013-06-09 DIAGNOSIS — K219 Gastro-esophageal reflux disease without esophagitis: Secondary | ICD-10-CM

## 2013-06-09 DIAGNOSIS — I10 Essential (primary) hypertension: Secondary | ICD-10-CM

## 2013-06-09 DIAGNOSIS — I6350 Cerebral infarction due to unspecified occlusion or stenosis of unspecified cerebral artery: Secondary | ICD-10-CM

## 2013-06-09 DIAGNOSIS — E1165 Type 2 diabetes mellitus with hyperglycemia: Secondary | ICD-10-CM

## 2013-06-09 DIAGNOSIS — E559 Vitamin D deficiency, unspecified: Secondary | ICD-10-CM

## 2013-06-09 NOTE — Progress Notes (Signed)
Patient ID: [name] [name], female   DOB: 07/14/1940, 73 y.o.   MRN: [id number]    [facility]   Place of Service: SNF (31)  PCP: REED, TIFFANY, DO  No Known Allergies  Chief Complaint  Patient presents with  . Medical Managment of Chronic Issues    HPI:  73 yo black female at [facility] for [facility] care due to her prior stroke with vascular dementia. She has pmh of diabetes on insulin, hypertension, hyperlipidemia, GERD and depression. Pt is being seen today for routine follow up on chronic conditions. Pt is doing well in current setting and without any complaints at this time; there has been no major changes in the last month; nursing does not have any concerns  Review of Systems:  Review of Systems  Constitutional: Negative for fever and chills.  Respiratory: Negative for cough and shortness of breath.   Cardiovascular: Negative for chest pain and leg swelling.  Gastrointestinal: Negative for heartburn, abdominal pain and constipation.  Genitourinary: Negative for dysuria.  Musculoskeletal: Negative for back pain, falls, joint pain and myalgias.  Skin: Negative.   Neurological: Positive for weakness. Negative for dizziness and headaches.  Psychiatric/Behavioral: Positive for memory loss. Negative for depression. The patient does not have insomnia.      Past Medical History  Diagnosis Date  . Diabetes mellitus   . Herpes simplex   . Hyperlipidemia   . Urinary tract infection   . Altered mental status   . Esophageal reflux   . Congenital coronary artery anomaly   . Cerebrovascular disease   . Depression   . Constipation   . Dementia   . Generalized abdominal pain    No past surgical history on file. Social History:   reports that she has quit smoking. She does not have any smokeless tobacco history on file. She reports that she does not drink alcohol or use illicit drugs.  No family history on  file.  Medications: Patient's Medications  New Prescriptions   No medications on file  Previous Medications   AMLODIPINE (NORVASC) 5 MG TABLET    Take 5 mg by mouth daily.   ASPIRIN 325 MG TABLET    Take 325 mg by mouth daily.   ATORVASTATIN (LIPITOR) 40 MG TABLET    Take 40 mg by mouth daily.   CHOLECALCIFEROL (VITAMIN D) 1000 UNITS TABLET    Take 1,000 Units by mouth daily.    DONEPEZIL (ARICEPT) 10 MG TABLET    Take 10 mg by mouth at bedtime.   INSULIN ASPART (NOVOLOG) 100 UNIT/ML INJECTION    Inject 5 Units into the skin 3 (three) times daily before meals. Only takes 5 units if cbg >150   INSULIN GLARGINE (LANTUS) 100 UNIT/ML INJECTION    Inject 23 Units into the skin at bedtime.    MEMANTINE (NAMENDA) 10 MG TABLET    Take 10 mg by mouth 2 (two) times daily.   POLYETHYL GLYCOL-PROPYL GLYCOL (SYSTANE) 0.4-0.3 % SOLN    Apply 1 drop to eye 3 (three) times daily.   RANITIDINE (ZANTAC) 75 MG TABLET    Take 150 mg by mouth at bedtime.   SERTRALINE (ZOLOFT) 50 MG TABLET    Take 50 mg by mouth daily.  Modified Medications   No medications on file  Discontinued Medications   No medications on file     Physical Exam:  Filed Vitals:   06/09/13 1252  BP: 117/60  Pulse: 72  Temp: 97.4 F (36.3 C)  Resp: 18    Physical Exam  Vitals reviewed. Constitutional: She is well-developed, well-nourished, and in no distress. No distress.  HENT:  Head: Normocephalic and atraumatic.  Mouth/Throat: Oropharynx is clear and moist. No oropharyngeal exudate.  Eyes: Conjunctivae and EOM are normal. Pupils are equal, round, and reactive to light.  Neck: Normal range of motion. Neck supple. No thyromegaly present.  Cardiovascular: Normal rate, regular rhythm and normal heart sounds.   Pulmonary/Chest: Effort normal and breath sounds normal.  Abdominal: Soft. Bowel sounds are normal. She exhibits no distension.  Musculoskeletal: She exhibits no edema and no tenderness.  Lymphadenopathy:    She has  no cervical adenopathy.  Neurological: She is alert.  Skin: Skin is warm and dry. She is not diaphoretic.  Psychiatric: Affect normal.     Labs reviewed: 11/18/2012:  Sodium 142, potassium 3.9, glucose 99, BUN 16, Cr 0.87  AST 16  ALT 9  AIC7.6    Assessment/Plan 1. Essential hypertension, benign -well controlled and conts on novasc for blood pressure  2. CVA (cerebral vascular accident) -conts on ASA, remains stable   3. GERD (gastroesophageal reflux disease) -without ongoing symptoms, conts on zantac   4. Type II or unspecified type diabetes mellitus with unspecified complication, uncontrolled HbA1c was 1/2 was 8.1, up from prior of 7.6 therefore lantus was increased CBG reviewed and stable; staff to monitor for hypoglycemic events.  5. Vascular dementia without behavioral disturbance - taking aricept and namenda; without major change in the last months   6. Unspecified vitamin D deficiency Vit D recently decreased due to stabilized vit D level; conts on vit d 1000 units   7. Anemia Without signs of bleeding hgb stable 11.4 on 03/14/2013 unchanged from a year ago at  11.1

## 2013-06-09 NOTE — Progress Notes (Signed)
This encounter was created in error - please disregard.

## 2013-07-03 LAB — HM DIABETES FOOT EXAM

## 2013-09-17 ENCOUNTER — Non-Acute Institutional Stay (SKILLED_NURSING_FACILITY): Payer: Medicare Other | Admitting: Internal Medicine

## 2013-09-17 ENCOUNTER — Encounter: Payer: Self-pay | Admitting: Internal Medicine

## 2013-09-17 DIAGNOSIS — I10 Essential (primary) hypertension: Secondary | ICD-10-CM

## 2013-09-17 DIAGNOSIS — E1165 Type 2 diabetes mellitus with hyperglycemia: Secondary | ICD-10-CM

## 2013-09-17 DIAGNOSIS — F3289 Other specified depressive episodes: Secondary | ICD-10-CM

## 2013-09-17 DIAGNOSIS — E118 Type 2 diabetes mellitus with unspecified complications: Principal | ICD-10-CM

## 2013-09-17 DIAGNOSIS — K219 Gastro-esophageal reflux disease without esophagitis: Secondary | ICD-10-CM

## 2013-09-17 DIAGNOSIS — IMO0002 Reserved for concepts with insufficient information to code with codable children: Secondary | ICD-10-CM

## 2013-09-17 DIAGNOSIS — F0150 Vascular dementia without behavioral disturbance: Secondary | ICD-10-CM

## 2013-09-17 DIAGNOSIS — F329 Major depressive disorder, single episode, unspecified: Secondary | ICD-10-CM

## 2013-09-17 DIAGNOSIS — E559 Vitamin D deficiency, unspecified: Secondary | ICD-10-CM

## 2013-09-17 DIAGNOSIS — F32A Depression, unspecified: Secondary | ICD-10-CM

## 2013-09-17 MED ORDER — LISINOPRIL 10 MG PO TABS: 10.0000 mg | ORAL_TABLET | Freq: Every day | ORAL | Status: DC

## 2013-09-17 NOTE — Progress Notes (Signed)
Patient ID: [name] [name], female   DOB: 04/18/1940, 73 y.o.   MRN: [id number]  [facility] Provider:  Gwenith Spitziffany L. Renato Gailseed, D.O., C.M.D.  Code Status:  Full code  Chief Complaint  Patient presents with  . Medical Management of Chronic Issues    HPI:  73 yo black female with DMII, HTN, hyperlipidemia, vitamin D deficiency, dry eyes, GERD, depression here for long term care due to her probably vascular type dementia.    Based on her hba1c of jan and her CBGs, she has been having high glucose due to drinking punch, sweet tea, lemonade and eating the snacks that are available.  Apparently, some staff were holding her regular insulin at times.  Her insulin regimen was just changed 1.5 wks ago.    Review of Systems:  Review of Systems  Constitutional: Negative for fever.  HENT: Negative for congestion.   Eyes: Negative for blurred vision.  Respiratory: Negative for shortness of breath.   Cardiovascular: Negative for chest pain and leg swelling.  Gastrointestinal: Negative for abdominal pain and constipation.  Genitourinary: Negative for dysuria.  Musculoskeletal: Negative for falls and myalgias.  Skin: Negative for rash.  Neurological: Negative for dizziness, loss of consciousness and headaches.  Endo/Heme/Allergies: Does not bruise/bleed easily.  Psychiatric/Behavioral: Positive for memory loss. Negative for depression.    Medications: Patient's Medications  New Prescriptions   No medications on file  Previous Medications   AMLODIPINE (NORVASC) 5 MG TABLET    Take 5 mg by mouth daily.   ASPIRIN 325 MG TABLET    Take 325 mg by mouth daily.   ATORVASTATIN (LIPITOR) 40 MG TABLET    Take 40 mg by mouth daily.   CHOLECALCIFEROL (VITAMIN D) 1000 UNITS TABLET    Take 1,000 Units by mouth daily.    DONEPEZIL (ARICEPT) 10 MG TABLET    Take 10 mg by mouth at bedtime.   INSULIN ASPART (NOVOLOG) 100 UNIT/ML INJECTION    Inject 5 Units into the skin 3 (three) times  daily before meals. Only takes 5 units if cbg >150   INSULIN GLARGINE (LANTUS) 100 UNIT/ML INJECTION    Inject 23 Units into the skin at bedtime.    MEMANTINE HCL ER (NAMENDA XR) 28 MG CP24    Take 28 mg by mouth daily.   POLYETHYL GLYCOL-PROPYL GLYCOL (SYSTANE) 0.4-0.3 % SOLN    Apply 1 drop to eye 3 (three) times daily.   RANITIDINE (ZANTAC) 75 MG TABLET    Take 150 mg by mouth at bedtime.   SERTRALINE (ZOLOFT) 50 MG TABLET    Take 50 mg by mouth daily.  Modified Medications   No medications on file  Discontinued Medications   MEMANTINE (NAMENDA) 10 MG TABLET    Take 10 mg by mouth 2 (two) times daily.    Physical Exam: Filed Vitals:   09/17/13 1324  BP: 148/63  Pulse: 67  Temp: 97.9 F (36.6 C)  Resp: 18  Height: 5\' 5"  (1.651 m)  Weight: 189 lb (85.73 kg)  SpO2: 95%  Physical Exam  Constitutional: She appears well-developed and well-nourished. No distress.  Was cold--had herself wrapped in a blanket  Cardiovascular: Normal rate, regular rhythm, normal heart sounds and intact distal pulses.   Pulmonary/Chest: Effort normal and breath sounds normal. No respiratory distress.  Abdominal: Soft. Bowel sounds are normal. She exhibits no distension and no mass. There is no tenderness.  Musculoskeletal: Normal range of motion. She exhibits no edema and no  tenderness.  Neurological: She is alert.  Oriented to person only  Skin: Skin is warm and dry.  Psychiatric: She has a normal mood and affect.    Labs reviewed: 03/14/13:  Wbc 6.4, h/h 11.4/35.9, plts 198, hba1c 8.1, vit D 41  Assessment/Plan 1. Type II or unspecified type diabetes mellitus with unspecified complication, uncontrolled - cont lantus with novolog meal coverage--I did an inservice with nursing staff to educate about different types of insulin and giving them as ordered -will f/u hba1c, check urine microalbumin  -cont asa, statin - change from norvasc to ace based on microalbumin -hba1c goal for her age and  comorbities is <8.5 per latest guidelines, but she will probably tolerate <8   2. Vascular dementia without behavioral disturbance -moderate to severe stage -does go to activities and roll around in the halls -cont aricept, namenda and secondary prevention of strokes with bp, lipid and glucose control  3. Essential hypertension, benign -bp goal due to age and fall risk is <150/90 despite her DMII and prior stroke  4. Gastroesophageal reflux disease without esophagitis -cont zantac at hs  5. Unspecified vitamin D deficiency -cont vitamin D supplement--has made levels adequate  6. Depression -cont low dose zoloft  Family/ staff Communication: discussed with nursing staff--sister visits and we will need to discuss goals of care with her when she is here  Goals of care: remains full code, is long term care resident  Labs/tests ordered:  Cbc, bmp, hba1c, fasting lipids next draw

## 2013-09-18 LAB — CBC AND DIFFERENTIAL
HCT: 37 % (ref 36–46)
Hemoglobin: 11.3 g/dL — AB (ref 12.0–16.0)
PLATELETS: 275 10*3/uL (ref 150–399)
WBC: 5.1 10*3/mL

## 2013-09-18 LAB — LIPID PANEL
CHOLESTEROL: 144 mg/dL (ref 0–200)
HDL: 56 mg/dL (ref 35–70)
LDL Cholesterol: 69 mg/dL
TRIGLYCERIDES: 96 mg/dL (ref 40–160)

## 2013-09-18 LAB — BASIC METABOLIC PANEL
BUN: 20 mg/dL (ref 4–21)
Creatinine: 0.9 mg/dL (ref 0.5–1.1)
Glucose: 210 mg/dL
Potassium: 4.7 mmol/L (ref 3.4–5.3)
Sodium: 144 mmol/L (ref 137–147)

## 2013-09-18 LAB — HEMOGLOBIN A1C: Hgb A1c MFr Bld: 8.1 % — AB (ref 4.0–6.0)

## 2013-10-07 LAB — HM DIABETES EYE EXAM

## 2013-12-31 ENCOUNTER — Non-Acute Institutional Stay (SKILLED_NURSING_FACILITY): Payer: Medicare Other | Admitting: Internal Medicine

## 2013-12-31 ENCOUNTER — Encounter: Payer: Self-pay | Admitting: Internal Medicine

## 2013-12-31 DIAGNOSIS — F32A Depression, unspecified: Secondary | ICD-10-CM

## 2013-12-31 DIAGNOSIS — I10 Essential (primary) hypertension: Secondary | ICD-10-CM

## 2013-12-31 DIAGNOSIS — E1149 Type 2 diabetes mellitus with other diabetic neurological complication: Secondary | ICD-10-CM

## 2013-12-31 DIAGNOSIS — M79605 Pain in left leg: Secondary | ICD-10-CM

## 2013-12-31 DIAGNOSIS — K219 Gastro-esophageal reflux disease without esophagitis: Secondary | ICD-10-CM

## 2013-12-31 DIAGNOSIS — F0150 Vascular dementia without behavioral disturbance: Secondary | ICD-10-CM

## 2013-12-31 DIAGNOSIS — F329 Major depressive disorder, single episode, unspecified: Secondary | ICD-10-CM

## 2013-12-31 MED ORDER — INSULIN GLARGINE 100 UNIT/ML ~~LOC~~ SOLN: 20.0000 [IU] | Freq: Every day | SUBCUTANEOUS | Status: AC

## 2013-12-31 NOTE — Progress Notes (Signed)
Patient ID: [name] [name], female   DOB: 10/11/1940, 73 y.o.   MRN: [id number]  [facility] Provider:  Gwenith Spitziffany L. Renato Gailseed, D.O., C.M.D.  Code Status:  Full code  Chief Complaint  Patient presents with  . Medical Management of Chronic Issues    HPI:  73 yo black female with h/o DMII with neuropathy, prior stroke, vascular dementia w/o behaviors, hyperlipidemia, depression seen for med mgt of chronic diseases.  Also, she had a fall and had c/o pain in her bottom and left hip and leg.  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    Review of Systems:  Review of Systems  Constitutional: Negative for fever.  Eyes: Negative for blurred vision.  Respiratory: Negative for shortness of breath.   Cardiovascular: Negative for chest pain.  Gastrointestinal: Negative for constipation, blood in stool and melena.  Genitourinary: Negative for dysuria.  Musculoskeletal: Positive for falls and joint pain. Negative for back pain.  Skin: Negative for rash.  Neurological: Negative for dizziness and headaches.  Psychiatric/Behavioral: Positive for memory loss. Negative for depression.    Medications: Patient's Medications  New Prescriptions   No medications on file  Previous Medications   ASPIRIN 325 MG TABLET    Take 325 mg by mouth daily.   ATORVASTATIN (LIPITOR) 40 MG TABLET    Take 40 mg by mouth daily.   CHOLECALCIFEROL (VITAMIN D) 1000 UNITS TABLET    Take 1,000 Units by mouth daily.    DONEPEZIL (ARICEPT) 10 MG TABLET    Take 10 mg by mouth at bedtime.   INSULIN ASPART (NOVOLOG) 100 UNIT/ML INJECTION    Inject 5 Units into the skin 3 (three) times daily before meals. Only takes 5 units if cbg >150   INSULIN GLARGINE (LANTUS) 100 UNIT/ML INJECTION    Inject 23 Units into the skin at bedtime.    LISINOPRIL (PRINIVIL,ZESTRIL) 10 MG TABLET    Take 1 tablet (10 mg total) by mouth daily.   MEMANTINE HCL ER  (NAMENDA XR) 28 MG CP24    Take 28 mg by mouth daily.   POLYETHYL GLYCOL-PROPYL GLYCOL (SYSTANE) 0.4-0.3 % SOLN    Apply 1 drop to eye 3 (three) times daily.   RANITIDINE (ZANTAC) 75 MG TABLET    Take 150 mg by mouth at bedtime.   SERTRALINE (ZOLOFT) 50 MG TABLET    Take 50 mg by mouth daily.  Modified Medications   No medications on file  Discontinued Medications   No medications on file    Physical Exam: Filed Vitals:   12/31/13 1259  BP: 125/69  Pulse: 69  Temp: 98 F (36.7 C)  Resp: 18  Height: 5\' 5"  (1.651 m)  Weight: 191 lb (86.637 kg)  SpO2: 95%  Physical Exam  Constitutional: She appears well-developed and well-nourished. No distress.  HENT:  Head: Normocephalic and atraumatic.  Cardiovascular: Normal rate, regular rhythm, normal heart sounds and intact distal pulses.   Pulmonary/Chest: Effort normal and breath sounds normal.  Abdominal: Soft. Bowel sounds are normal. She exhibits no distension and no mass. There is no tenderness.  Musculoskeletal: She exhibits tenderness.  Of left thigh;  Difficulty keeping hip flexed on left for prolonged time  Neurological: She is alert.  Skin: Skin is warm and dry.  Ecchymoses over left buttock, hip area     Labs reviewed: Abstracted from 09/18/13  Assessment/Plan 1. 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 Vascular dementia without behavioral disturbance -cont aricept and namenda XR, still participates in activities and can transfer with standby -sometimes forgets and self-transfers (had a fall doing this)  3. Gastroesophageal reflux disease, esophagitis presence not specified -cont zantac  4. Depression -cont zoloft low  dose  5. Essential hypertension, benign -bp at goal with lisinopril only  6.  Left leg pain -s/p fall during self-transfer -leg painful to lift when I pushed her from bingo back to her room -xrays were all negative -if pain persists and interferes with normal function would check CT hips and pelvis to r/o fracture not visible on portable xrays -cont use of tylenol for pain

## 2014-01-02 LAB — HEMOGLOBIN A1C: Hgb A1c MFr Bld: 7.8 % — AB (ref 4.0–6.0)

## 2014-01-21 ENCOUNTER — Encounter: Payer: Self-pay | Admitting: Internal Medicine

## 2014-01-21 ENCOUNTER — Non-Acute Institutional Stay (SKILLED_NURSING_FACILITY): Payer: Medicare Other | Admitting: Internal Medicine

## 2014-01-21 DIAGNOSIS — I10 Essential (primary) hypertension: Secondary | ICD-10-CM

## 2014-01-21 DIAGNOSIS — E119 Type 2 diabetes mellitus without complications: Secondary | ICD-10-CM

## 2014-01-21 DIAGNOSIS — E1149 Type 2 diabetes mellitus with other diabetic neurological complication: Secondary | ICD-10-CM

## 2014-01-21 DIAGNOSIS — E785 Hyperlipidemia, unspecified: Secondary | ICD-10-CM

## 2014-01-21 DIAGNOSIS — F0150 Vascular dementia without behavioral disturbance: Secondary | ICD-10-CM

## 2014-01-21 NOTE — Progress Notes (Signed)
Patient ID: [name] [name], female   DOB: 09/18/1940, 73 y.o.   MRN: [id number]   [facility]  No Known Allergies  Code Status: Full Code  Goals of Care: Longevity/Long term care  Chief Complaint  Patient presents with  . Medical Management of Chronic Issues    DM2, dementia, HTN, HLD    HPI 73 y.o. female with PMH of DM2, dementia, HTN, HLD among others is being seen for a routine visit. Weight stable. No change in behaviors or functional status. No recent falls or new skin concerns reported. No concerns from staff. No complaints verbalized from patient.   Review of Systems Constitutional: Negative for fever and chills HENT: Negative for congestion and sore throat. Wears partial denture Eyes: Negative for eye pain and visual disturbance  Cardiovascular: Negative for chest pain and leg swelling Respiratory: Negative cough, shortness of breath, and wheezing.  Gastrointestinal: Negative for nausea and vomiting. Negative for abdominal pain, diarrhea and constipation.  Musculoskeletal: Negative for back pain, joint pain, and joint swelling  Neurological: Negative for dizziness and  Headache Skin: Negative for rash and wound.   Psychiatric: Negative for depression.    Past Medical History  Diagnosis Date  . Diabetes mellitus   . Herpes simplex   . Hyperlipidemia   . Urinary tract infection   . Altered mental status   . Esophageal reflux   . Congenital coronary artery anomaly   . Cerebrovascular disease   . Depression   . Constipation   . Dementia   . Generalized abdominal pain     History reviewed. No pertinent past surgical history.  History   Social History  . Marital Status: Widowed    Spouse Name: N/A    Number of Children: N/A  . Years of Education: N/A   Occupational History  . Not on file.   Social History Main Topics  . Smoking status: Former Games developermoker  . Smokeless tobacco: Not on file  . Alcohol Use: No  . Drug  Use: No  . Sexual Activity: Not on file   Other Topics Concern  . Not on file   Social History Narrative      Medication List       This list is accurate as of: 01/21/14  3:24 PM.  Always use your most recent med list.               aspirin 325 MG tablet  Take 325 mg by mouth daily.     atorvastatin 40 MG tablet  Commonly known as:  LIPITOR  Take 40 mg by mouth daily.     cholecalciferol 1000 UNITS tablet  Commonly known as:  VITAMIN D  Take 1,000 Units by mouth daily.     donepezil 10 MG tablet  Commonly known as:  ARICEPT  Take 10 mg by mouth at bedtime.     insulin aspart 100 UNIT/ML injection  Commonly known as:  novoLOG  Inject 5 Units into the skin 3 (three) times daily before meals. Only takes 5 units if cbg >150     insulin glargine 100 UNIT/ML injection  Commonly known as:  LANTUS  Inject 0.2 mLs (20 Units total) into the skin at bedtime.     lisinopril 10 MG tablet  Commonly known as:  PRINIVIL,ZESTRIL  Take 1 tablet (10 mg total) by mouth daily.     NAMENDA XR 28 MG Cp24  Generic drug:  Memantine HCl ER  Take 28 mg by  mouth daily.     ranitidine 75 MG tablet  Commonly known as:  ZANTAC  Take 150 mg by mouth at bedtime.     sertraline 50 MG tablet  Commonly known as:  ZOLOFT  Take 50 mg by mouth daily.     SYSTANE 0.4-0.3 % Soln  Generic drug:  Polyethyl Glycol-Propyl Glycol  Apply 1 drop to eye 3 (three) times daily.        Physical Exam Filed Vitals:   01/21/14 1401  BP: 143/88  Pulse: 71  Temp: 96.2 F (35.7 C)  Resp: 19   Constitutional: WDWN elderly female in no acute distress. Conversant and pleasant HEENT: Normocephalic and atraumatic. PERRL. EOM intact. No icterus. Oral mucosa moist. Posterior pharynx clear of any exudate or lesions. Partial denture in place Neck: Supple and nontender. No lymphadenopathy, masses, or thyromegaly. No JVD or carotid bruits. Cardiac: Normal S1, S2. RRR without appreciable murmurs, rubs, or  gallops. Distal pulses intact. No dependent edema.  Lungs: No respiratory distress. Breath sounds clear bilaterally without rales, rhonchi, or wheezes. Abdomen: Audible bowel sounds in all quadrants. Soft, nontender, nondistended. No palpable mass.  Musculoskeletal: Able to move all extremities. Skin: Warm and dry. No rash noted. No erythema.  Neurological: Alert and oriented to person Psychiatric: . Appropriate mood and affect.   Labs Reviewed CBC Latest Ref Rng 09/18/2013 08/27/2011 01/19/2010  WBC - 5.1 5.0 9.8  Hemoglobin 12.0 - 16.0 g/dL 11.3(A) 10.7(L) 9.1(L)  Hematocrit 36 - 46 % 37 33.9(L) 27.9(L)  Platelets 150 - 399 K/L 275 188 157    CMP     Component Value Date/Time   NA 144 09/18/2013   NA 140 08/27/2011 1803   K 4.7 09/18/2013   CL 103 08/27/2011 1803   CO2 28 08/27/2011 1803   GLUCOSE 148* 08/27/2011 1803   BUN 20 09/18/2013   BUN 14 08/27/2011 1803   CREATININE 0.9 09/18/2013   CREATININE 0.73 08/27/2011 1803   CALCIUM 9.4 08/27/2011 1803   PROT 7.3 01/17/2010 1318   ALBUMIN 3.7 01/17/2010 1318   AST 23 01/17/2010 1318   ALT 16 01/17/2010 1318   ALKPHOS 69 01/17/2010 1318   BILITOT 0.5 01/17/2010 1318   GFRNONAA 85* 08/27/2011 1803   GFRAA >90 08/27/2011 1803    Lab Results  Component Value Date   HGBA1C 7.8* 01/02/2014   Lipid Panel     Component Value Date/Time   CHOL 144 09/18/2013   TRIG 96 09/18/2013   HDL 56 09/18/2013   CHOLHDL 2.8 03/07/2008 0445   VLDL 17 03/07/2008 0445   LDLCALC 69 09/18/2013    Assessment & Plan 1. 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 CBGs 80s to 200s. Continue lantus 20 units daily at bedtime and mealtime insulin 5 units TID with meals for cbgs>150. Continue low dose asa, ACEI, and statin. Eye and foot exam are up to date. Continue to monitor.   2. Essential hypertension, benign Stable. Continue lisinopril 10mg  daily and monitor  3. Dementia, vascular,  without behavioral disturbance Stable. Continue namenda xr 28mg  daily and aricept 10mg  daily. Continue to monitor for change in behaviors. Continue fall risk and pressure prophylaxis.  4. Dyslipidemia LDL at goal. Continue lipitor 40mg  daily and monitor.   5. Encounter for diabetic foot exam See health maintenance and quality metrics sections.   Family/Staff Communication Plan of care discuss with resident and professional staff members. Resident and professional staff members verbalize understanding and agree  with plan of care. No additional questions or concerns reported.    Arthur Holms, MSN, AGNP-C [facility] [address] [employer], [place] [address] [phone] [8am-5pm] After hours: [phone]

## 2014-02-20 ENCOUNTER — Non-Acute Institutional Stay (SKILLED_NURSING_FACILITY): Payer: Medicare Other | Admitting: Adult Health

## 2014-02-20 DIAGNOSIS — F329 Major depressive disorder, single episode, unspecified: Secondary | ICD-10-CM

## 2014-02-20 DIAGNOSIS — F32A Depression, unspecified: Secondary | ICD-10-CM

## 2014-02-20 DIAGNOSIS — K219 Gastro-esophageal reflux disease without esophagitis: Secondary | ICD-10-CM

## 2014-02-20 DIAGNOSIS — I10 Essential (primary) hypertension: Secondary | ICD-10-CM

## 2014-02-20 DIAGNOSIS — I639 Cerebral infarction, unspecified: Secondary | ICD-10-CM

## 2014-02-20 DIAGNOSIS — F0150 Vascular dementia without behavioral disturbance: Secondary | ICD-10-CM

## 2014-02-20 DIAGNOSIS — E1149 Type 2 diabetes mellitus with other diabetic neurological complication: Secondary | ICD-10-CM

## 2014-02-23 ENCOUNTER — Non-Acute Institutional Stay (SKILLED_NURSING_FACILITY): Payer: Medicare Other | Admitting: Adult Health

## 2014-02-23 DIAGNOSIS — N179 Acute kidney failure, unspecified: Secondary | ICD-10-CM

## 2014-02-23 DIAGNOSIS — F0150 Vascular dementia without behavioral disturbance: Secondary | ICD-10-CM

## 2014-02-23 DIAGNOSIS — K5901 Slow transit constipation: Secondary | ICD-10-CM

## 2014-02-25 ENCOUNTER — Encounter: Payer: Self-pay | Admitting: Adult Health

## 2014-02-25 DIAGNOSIS — N179 Acute kidney failure, unspecified: Secondary | ICD-10-CM | POA: Insufficient documentation

## 2014-02-25 DIAGNOSIS — K5901 Slow transit constipation: Secondary | ICD-10-CM | POA: Insufficient documentation

## 2014-02-25 MED ORDER — POLYETHYLENE GLYCOL 3350 17 GM/SCOOP PO POWD: 17.0000 g | Freq: Every day | ORAL | Status: AC

## 2014-02-25 MED ORDER — MEMANTINE HCL ER 14 MG PO CP24: 14.0000 mg | ORAL_CAPSULE | Freq: Every day | ORAL | Status: DC

## 2014-02-25 MED ORDER — DONEPEZIL HCL 10 MG PO TABS: 5.0000 mg | ORAL_TABLET | Freq: Every day | ORAL | Status: DC

## 2014-02-25 NOTE — Progress Notes (Signed)
Patient ID: [name] [name], female   DOB: 01/11/1941, 73 y.o.   MRN: [id number]  [facility]     No Known Allergies     Chief Complaint  Patient presents with  . Medical Management of Chronic Issues    HPI:  She is a long term resident of this facility being seen for the management of her chronic illnesses. She is unable to fully participate in the hpi or ros. She states that she feels good today. There are no concerns being voiced by the nursing staff today. There has no significant change in her overall status in the recent past.    Past Medical History  Diagnosis Date  . Diabetes mellitus   . Herpes simplex   . Hyperlipidemia   . Urinary tract infection   . Altered mental status   . Esophageal reflux   . Congenital coronary artery anomaly   . Cerebrovascular disease   . Depression   . Constipation   . Dementia   . Generalized abdominal pain     No past surgical history on file.  VITAL SIGNS BP 125/69 mmHg  Pulse 69  Ht 5\' 5"  (1.651 m)  Wt 188 lb (85.276 kg)  BMI 31.28 kg/m2  SpO2 95%   Outpatient Encounter Prescriptions as of 02/20/2014  Medication Sig  . aspirin 325 MG tablet Take 325 mg by mouth daily.  Marland Kitchen. atorvastatin (LIPITOR) 40 MG tablet Take 40 mg by mouth daily.  . cholecalciferol (VITAMIN D) 1000 UNITS tablet Take 2,000 Units by mouth daily.   Marland Kitchen. donepezil (ARICEPT) 10 MG tablet Take 10 mg by mouth at bedtime.  . insulin aspart (NOVOLOG) 100 UNIT/ML injection Inject 5 Units into the skin 3 (three) times daily before meals. Only takes 5 units if cbg >150  . insulin glargine (LANTUS) 100 UNIT/ML injection Inject 0.2 mLs (20 Units total) into the skin at bedtime.  Marland Kitchen. lisinopril (PRINIVIL,ZESTRIL) 10 MG tablet Take 1 tablet (10 mg total) by mouth daily.  . Memantine HCl ER (NAMENDA XR) 28 MG CP24 Take 28 mg by mouth daily.  Bertram Gala. Polyethyl Glycol-Propyl Glycol (SYSTANE) 0.4-0.3 % SOLN Apply 1 drop to eye 3 (three) times daily.  . ranitidine (ZANTAC) 75 MG  tablet Take 150 mg by mouth at bedtime.  . sertraline (ZOLOFT) 50 MG tablet Take 50 mg by mouth daily.     SIGNIFICANT DIAGNOSTIC EXAMS  LABS REVIEWED:   09-18-13: wbc 5.1; hgb 11.3; hct 37.3; mcv 88.4; plt 275;glucose 210; bun 20.5; creat 0.88; k+4.7; na++144; chol 144; ldl 69; trig 96;  hgb a1c 8.1 09-19-13: urine micro-albumin 2.5 01-02-14: hgb a1c 7.8      Review of Systems  Unable to perform ROS    Physical Exam  Constitutional: She appears well-developed and well-nourished. No distress.  Neck: Neck supple. No JVD present. No thyromegaly present.  Cardiovascular: Normal rate, regular rhythm and intact distal pulses.   Respiratory: Effort normal and breath sounds normal. No respiratory distress.  GI: Soft. Bowel sounds are normal. She exhibits no distension. There is no tenderness.  Musculoskeletal: She exhibits no edema.  Is able to move all extremities   Neurological: She is alert.  Skin: Skin is warm and dry. She is not diaphoretic.     ASSESSMENT/ PLAN:  1.  Dyslipidemia: her ldl is 2769; will continue lipitor 40 mg daily and will monitor her status   2. Diabetes: her hgb a1c is 7.8; will continue lantus 20 units daily and will continue novolog 5  units prior to meals for cbg >=150 will monitor   3. Hypertension: is stable will continue lisinopril 10 mg daily; her urine micro-albumin is 2.5. Will not make changes will monitor her status.   4. CVA: she is neurologically stable will continue her asa 325 mg daily and will monitor   5. Gerd: will continue zantac 150 mg daily   6. Vascular dementia: no significant change in her status; will continue aricept 10 mg daily and namenda xr 28 mg daily will monitor  7. Depression: she is benefiting from zoloft 50 mg daily will make changes will monitor     Synthia Innocenteborah Tyon Cerasoli NP [facility]  Contact [phone] Monday through Friday 8am- 5pm  After hours call [phone]

## 2014-02-25 NOTE — Progress Notes (Signed)
Patient ID: [name] [name], female   DOB: 06/20/1940, 73 y.o.   MRN: [id number]  [facility]     No Known Allergies     Chief Complaint  Patient presents with  . Acute Visit    follow up status     HPI:  Per nursing staff she had a near syncopal episode earlier this morning; while having a bm. She states that she is feeling good. Is not having any complaints at this time. Her renal function has declined since July with a creat 0.88; and current of 1.98. There are no reports of change in appetite or fluid intake.    Past Medical History  Diagnosis Date  . Diabetes mellitus   . Herpes simplex   . Hyperlipidemia   . Urinary tract infection   . Altered mental status   . Esophageal reflux   . Congenital coronary artery anomaly   . Cerebrovascular disease   . Depression   . Constipation   . Dementia   . Generalized abdominal pain     No past surgical history on file.  VITAL SIGNS BP 125/69 mmHg  Pulse 69  Ht 5\' 5"  (1.651 m)  Wt 188 lb (85.276 kg)  BMI 31.28 kg/m2  SpO2 96%   Outpatient Encounter Prescriptions as of 02/23/2014  Medication Sig  . aspirin 325 MG tablet Take 325 mg by mouth daily.  Marland Kitchen. atorvastatin (LIPITOR) 40 MG tablet Take 40 mg by mouth daily.  . cholecalciferol (VITAMIN D) 1000 UNITS tablet Take 2,000 Units by mouth daily.   Marland Kitchen. donepezil (ARICEPT) 10 MG tablet Take 10 mg by mouth at bedtime.  . insulin aspart (NOVOLOG) 100 UNIT/ML injection Inject 5 Units into the skin 3 (three) times daily before meals. Only takes 5 units if cbg >150  . insulin glargine (LANTUS) 100 UNIT/ML injection Inject 0.2 mLs (20 Units total) into the skin at bedtime.  Marland Kitchen. lisinopril (PRINIVIL,ZESTRIL) 10 MG tablet Take 1 tablet (10 mg total) by mouth daily.  . Memantine HCl ER (NAMENDA XR) 28 MG CP24 Take 28 mg by mouth daily.  Bertram Gala. Polyethyl Glycol-Propyl Glycol (SYSTANE) 0.4-0.3 % SOLN Apply 1 drop to eye 3 (three) times daily.  . ranitidine (ZANTAC) 75 MG tablet Take 150 mg by  mouth at bedtime.  . sertraline (ZOLOFT) 50 MG tablet Take 50 mg by mouth daily.     SIGNIFICANT DIAGNOSTIC EXAMS   LABS REVIEWED:   09-18-13: wbc 5.1; hgb 11.3; hct 37.3; mcv 88.4; plt 275;glucose 210; bun 20.5; creat 0.88; k+4.7; na++144; chol 144; ldl 69; trig 96;  hgb a1c 8.1 09-19-13: urine micro-albumin 2.5 01-02-14: hgb a1c 7.8  02-22-14: wbc 5.4; hgb 11.1; hct 35.2 ;mcv 88.5; plt 161; glucose 198; bun 31; creat 1.98; k+4.4; na++138       Review of Systems  Constitutional: Negative for malaise/fatigue.  Respiratory: Negative for cough and shortness of breath.   Cardiovascular: Negative for chest pain and palpitations.  Gastrointestinal: Positive for constipation. Negative for abdominal pain.  Musculoskeletal: Negative for myalgias and back pain.  Neurological: Negative for headaches.  Psychiatric/Behavioral: The patient is not nervous/anxious.      Physical Exam  Constitutional: She appears well-developed and well-nourished. No distress.  Neck: Neck supple. No JVD present. No thyromegaly present.  Cardiovascular: Normal rate, regular rhythm and intact distal pulses.   Respiratory: Effort normal and breath sounds normal. No respiratory distress.  GI: Soft. Bowel sounds are normal. She exhibits no distension. There is no tenderness.  Musculoskeletal: She  exhibits no edema.  Is able to move all extremities   Neurological: She is alert.  Skin: Skin is warm and dry. She is not diaphoretic.    ASSESSMENT/ PLAN:  1. Renal failure: will lower her aricept to 5 mg daily; and lower namenda to 14 mg dialy; will begin NS at 75 cc per hour for one liter and will then repeat bmp.   2. Constipation: will begin miralax daily and will monitor her status.    Synthia Innocenteborah Green NP [facility]  Contact [phone] Monday through Friday 8am- 5pm  After hours call [phone]

## 2014-03-09 LAB — CBC AND DIFFERENTIAL
HCT: 35 % — AB (ref 36–46)
HEMOGLOBIN: 11.1 g/dL — AB (ref 12.0–16.0)

## 2014-03-09 LAB — BASIC METABOLIC PANEL
BUN: 31 mg/dL — AB (ref 4–21)
Creatinine: 2 mg/dL — AB (ref 0.5–1.1)
Glucose: 198 mg/dL
Potassium: 4.4 mmol/L (ref 3.4–5.3)
Sodium: 138 mmol/L (ref 137–147)

## 2014-04-04 ENCOUNTER — Non-Acute Institutional Stay (SKILLED_NURSING_FACILITY): Payer: Medicare Other | Admitting: Internal Medicine

## 2014-04-04 DIAGNOSIS — I10 Essential (primary) hypertension: Secondary | ICD-10-CM

## 2014-04-04 DIAGNOSIS — E1149 Type 2 diabetes mellitus with other diabetic neurological complication: Secondary | ICD-10-CM

## 2014-04-04 DIAGNOSIS — F0150 Vascular dementia without behavioral disturbance: Secondary | ICD-10-CM | POA: Diagnosis not present

## 2014-04-04 DIAGNOSIS — I6930 Unspecified sequelae of cerebral infarction: Secondary | ICD-10-CM | POA: Diagnosis not present

## 2014-04-04 DIAGNOSIS — F329 Major depressive disorder, single episode, unspecified: Secondary | ICD-10-CM

## 2014-04-04 DIAGNOSIS — F32A Depression, unspecified: Secondary | ICD-10-CM

## 2014-04-04 NOTE — Progress Notes (Signed)
Patient ID: [name] [name], female   DOB: 01-23-1941, 74 y.o.   MRN: [id number]    Facility  [facility]    Place of Service:   SNF   No Known Allergies  Chief Complaint  Patient presents with  . Medical Management of Chronic Issues    DM (A1c 7.8% in 10/15), vascular dementia, hyperlipidemia, CAD, esophageal reflux, CVD with hx CVA, HTN, depression    HPI:  74 yo female long term resident seen today for above. She has been stable. No nursing issues. Unable to obtain further hx due to pt's dementia  Medications: Patient's Medications  New Prescriptions   No medications on file  Previous Medications   ATORVASTATIN (LIPITOR) 40 MG TABLET    Take 40 mg by mouth daily.   CHOLECALCIFEROL (VITAMIN D) 1000 UNITS TABLET    Take 2,000 Units by mouth daily.    INSULIN ASPART (NOVOLOG) 100 UNIT/ML INJECTION    Inject 5 Units into the skin 3 (three) times daily before meals. Only takes 5 units if cbg >150   INSULIN GLARGINE (LANTUS) 100 UNIT/ML INJECTION    Inject 0.2 mLs (20 Units total) into the skin at bedtime.   LISINOPRIL (PRINIVIL,ZESTRIL) 10 MG TABLET    Take 1 tablet (10 mg total) by mouth daily.   MEMANTINE HCL ER 14 MG CP24    Take 1 tablet (14 mg total) by mouth daily.   POLYETHYL GLYCOL-PROPYL GLYCOL (SYSTANE) 0.4-0.3 % SOLN    Apply 1 drop to eye 3 (three) times daily.   POLYETHYLENE GLYCOL POWDER (GLYCOLAX/MIRALAX) POWDER    Take 17 g by mouth daily.   RANITIDINE (ZANTAC) 75 MG TABLET    Take 150 mg by mouth at bedtime.  Modified Medications   No medications on file  Discontinued Medications   ASPIRIN 325 MG TABLET    Take 325 mg by mouth daily.   DONEPEZIL (ARICEPT) 10 MG TABLET    Take 0.5 tablets (5 mg total) by mouth at bedtime.   SERTRALINE (ZOLOFT) 50 MG TABLET    Take 50 mg by mouth daily.     Review of Systems  Unable to obtain due to pt's mental status   Filed Vitals:   04/02/14 2052  BP: 124/76  Pulse: 68  Temp: 98 F (36.7 C)  Weight: 190  lb (86.183 kg)   Body mass index is 31.62 kg/(m^2).  Physical Exam CONSTITUTIONAL: Looks frail in NAD. Awake and alert  HEENT: PERRLA. No scleral icterus. Oropharynx clear and without exudate NECK: Supple. Nontender. No palpable cervical or supraclavicular lymph nodes. No carotid bruit b/l.  CVS: Regular rate without murmur, gallop or rub. LUNGS: CTA b/l no wheezing, rales or rhonchi. ABDOMEN: Bowel sounds present x 4. Soft, nontender, nondistended. No palpable mass or bruit EXTREMITIES: trace LE edema b/l. Distal pulses palpable. No calf tenderness PSYCH: Affect, behavior and mood normal    Labs reviewed: Nursing Home on 04/04/2014  Component Date Value Ref Range Status  . Hemoglobin 03/09/2014 11.1* 12.0 - 16.0 g/dL Final  . HCT 78/29/5621 35* 36 - 46 % Final  . Glucose 03/09/2014 198   Final  . BUN 03/09/2014 31* 4 - 21 mg/dL Final  . Creatinine 30/86/5784 2.0* 0.5 - 1.1 mg/dL Final  . Potassium 69/62/9528 4.4  3.4 - 5.3 mmol/L Final  . Sodium 03/09/2014 138  137 - 147 mmol/L Final  Nursing Home on 01/21/2014  Component Date Value Ref Range Status  . Hgb A1c MFr Bld  01/02/2014 7.8* 4.0 - 6.0 % Final   LFTs nml last month  Assessment/Plan   ICD-9-CM ICD-10-CM   1. Vascular dementia without behavioral disturbance 290.40 F01.50   2. 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 Essential hypertension, benign 401.1 I10   4. Depression 311 F32.9   5. History of stroke with residual deficit 438.9 I69.30    --medically stable on current tx plan. Continue current medications as ordered  --check CMP, lipid panel and A1c q 6mos   Raseel Jans S. Ancil Linseyarter, D. O., F. A. C. O. I.  [facility] [address] [place], [place] [address] [phone] Office (Wednesdays and Fridays 8 AM - 5 PM) [phone] Cell (Monday-Friday 8 AM - 5 PM)

## 2014-04-05 ENCOUNTER — Encounter: Payer: Self-pay | Admitting: Internal Medicine

## 2014-04-07 DIAGNOSIS — M79674 Pain in right toe(s): Secondary | ICD-10-CM | POA: Diagnosis not present

## 2014-04-07 DIAGNOSIS — I639 Cerebral infarction, unspecified: Secondary | ICD-10-CM | POA: Diagnosis not present

## 2014-04-07 DIAGNOSIS — E119 Type 2 diabetes mellitus without complications: Secondary | ICD-10-CM | POA: Diagnosis not present

## 2014-04-07 DIAGNOSIS — B351 Tinea unguium: Secondary | ICD-10-CM | POA: Diagnosis not present

## 2014-04-07 DIAGNOSIS — M79675 Pain in left toe(s): Secondary | ICD-10-CM | POA: Diagnosis not present

## 2014-05-13 ENCOUNTER — Non-Acute Institutional Stay (SKILLED_NURSING_FACILITY): Payer: Medicare Other | Admitting: Adult Health

## 2014-05-13 DIAGNOSIS — E1149 Type 2 diabetes mellitus with other diabetic neurological complication: Secondary | ICD-10-CM

## 2014-05-13 DIAGNOSIS — F32A Depression, unspecified: Secondary | ICD-10-CM

## 2014-05-13 DIAGNOSIS — K219 Gastro-esophageal reflux disease without esophagitis: Secondary | ICD-10-CM

## 2014-05-13 DIAGNOSIS — F329 Major depressive disorder, single episode, unspecified: Secondary | ICD-10-CM | POA: Diagnosis not present

## 2014-05-13 DIAGNOSIS — I10 Essential (primary) hypertension: Secondary | ICD-10-CM

## 2014-05-13 DIAGNOSIS — E785 Hyperlipidemia, unspecified: Secondary | ICD-10-CM | POA: Diagnosis not present

## 2014-05-13 DIAGNOSIS — F0150 Vascular dementia without behavioral disturbance: Secondary | ICD-10-CM | POA: Diagnosis not present

## 2014-05-13 DIAGNOSIS — I6930 Unspecified sequelae of cerebral infarction: Secondary | ICD-10-CM

## 2014-05-14 DIAGNOSIS — F0390 Unspecified dementia without behavioral disturbance: Secondary | ICD-10-CM | POA: Diagnosis not present

## 2014-05-14 DIAGNOSIS — E785 Hyperlipidemia, unspecified: Secondary | ICD-10-CM | POA: Diagnosis not present

## 2014-05-14 DIAGNOSIS — E119 Type 2 diabetes mellitus without complications: Secondary | ICD-10-CM | POA: Diagnosis not present

## 2014-05-14 LAB — LIPID PANEL
CHOLESTEROL: 127 mg/dL (ref 0–200)
HDL: 46 mg/dL (ref 35–70)
LDL Cholesterol: 65 mg/dL
LDL/HDL RATIO: 2.7
Triglycerides: 76 mg/dL (ref 40–160)

## 2014-05-14 LAB — HEMOGLOBIN A1C: Hgb A1c MFr Bld: 8.2 % — AB (ref 4.0–6.0)

## 2014-06-17 ENCOUNTER — Encounter: Payer: Self-pay | Admitting: Adult Health

## 2014-06-17 DIAGNOSIS — E785 Hyperlipidemia, unspecified: Secondary | ICD-10-CM | POA: Insufficient documentation

## 2014-06-17 NOTE — Progress Notes (Signed)
Patient ID: [name] [name], female   DOB: 05/10/1940, 74 y.o.   MRN: [id number]  [facility]     No Known Allergies     Chief Complaint  Patient presents with  . Medical Management of Chronic Issues    HPI:  She is a long term resident of this facility being seen for the management of her chronic illnesses. Overall her status remains without change. She is unable to fully participate in the hpi or ros; but states that she is feeling good. There are no nursing concerns at this time.    Past Medical History  Diagnosis Date  . Diabetes mellitus   . Herpes simplex   . Hyperlipidemia   . Urinary tract infection   . Altered mental status   . Esophageal reflux   . Congenital coronary artery anomaly   . Cerebrovascular disease   . Depression   . Constipation   . Dementia   . Generalized abdominal pain     No past surgical history on file.  VITAL SIGNS BP 124/76 mmHg  Pulse 68  Ht 5\' 4"  (1.626 m)  Wt 188 lb (85.276 kg)  BMI 32.25 kg/m2  SpO2 95%   Outpatient Encounter Prescriptions as of 05/13/2014  Medication Sig  . aspirin 325 MG EC tablet Take 325 mg by mouth daily.  Marland Kitchen. atorvastatin (LIPITOR) 40 MG tablet Take 40 mg by mouth daily.  . cholecalciferol (VITAMIN D) 1000 UNITS tablet Take 2,000 Units by mouth daily.   . insulin aspart (NOVOLOG) 100 UNIT/ML injection Inject 5 Units into the skin 3 (three) times daily before meals. Only takes 5 units if cbg >150  . insulin glargine (LANTUS) 100 UNIT/ML injection Inject 0.2 mLs (20 Units total) into the skin at bedtime.  Marland Kitchen. lisinopril (PRINIVIL,ZESTRIL) 10 MG tablet Take 1 tablet (10 mg total) by mouth daily.  . Memantine HCl ER 14 MG CP24 Take 1 tablet (14 mg total) by mouth daily.  Bertram Gala. Polyethyl Glycol-Propyl Glycol (SYSTANE) 0.4-0.3 % SOLN Apply 1 drop to eye 3 (three) times daily.  . polyethylene glycol powder (GLYCOLAX/MIRALAX) powder Take 17 g by mouth daily.  . ranitidine (ZANTAC) 75 MG tablet Take 150 mg by mouth at  bedtime.  . sertraline (ZOLOFT) 50 MG tablet Take 50 mg by mouth daily.     SIGNIFICANT DIAGNOSTIC EXAMS    LABS REVIEWED:   09-18-13: wbc 5.1; hgb 11.3; hct 37.3; mcv 88.4; plt 275;glucose 210; bun 20.5; creat 0.88; k+4.7; na++144; chol 144; ldl 69; trig 96;  hgb a1c 8.1 09-19-13: urine micro-albumin 2.5 01-02-14: hgb a1c 7.8  02-22-14: wbc 5.4; hgb 11.1; hct 35.2 ;mcv 88.5; plt 161; glucose 198; bun 31; creat 1.98; k+4.4; na++138      ROS Unable to perform ROS   Physical Exam Constitutional: She appears well-developed and well-nourished. No distress.  Neck: Neck supple. No JVD present. No thyromegaly present.  Cardiovascular: Normal rate, regular rhythm and intact distal pulses.   Respiratory: Effort normal and breath sounds normal. No respiratory distress.  GI: Soft. Bowel sounds are normal. She exhibits no distension. There is no tenderness.  Musculoskeletal: She exhibits no edema.  Is able to move all extremities   Neurological: She is alert.  Skin: Skin is warm and dry. She is not diaphoretic.       ASSESSMENT/ PLAN:   1.  Dyslipidemia: her ldl is 769; will continue lipitor 40 mg daily and will monitor her status   2. Diabetes: her hgb a1c is 7.8;  will continue lantus 20 units daily and will continue novolog 5 units prior to meals for cbg >=150 will monitor   3. Hypertension: is stable will continue lisinopril 10 mg daily; her urine micro-albumin is 2.5. Will not make changes will monitor her status.   4. CVA: she is neurologically stable will continue her asa 325 mg daily and will monitor   5. Gerd: will continue zantac 150 mg daily   6. Vascular dementia: no significant change in her status; will  namenda xr 14 mg daily will monitor  7. Depression: she is benefiting from zoloft 50 mg daily will make changes will monitor    Will check hgb a1c; lipids     Synthia Innocent NP [facility]  Contact [phone] Monday through Friday 8am- 5pm    After hours call [phone]

## 2014-06-23 ENCOUNTER — Non-Acute Institutional Stay (SKILLED_NURSING_FACILITY): Payer: Medicare Other | Admitting: Adult Health

## 2014-06-23 DIAGNOSIS — E1149 Type 2 diabetes mellitus with other diabetic neurological complication: Secondary | ICD-10-CM | POA: Diagnosis not present

## 2014-06-23 DIAGNOSIS — E785 Hyperlipidemia, unspecified: Secondary | ICD-10-CM | POA: Diagnosis not present

## 2014-06-23 DIAGNOSIS — F0150 Vascular dementia without behavioral disturbance: Secondary | ICD-10-CM

## 2014-06-23 DIAGNOSIS — I6930 Unspecified sequelae of cerebral infarction: Secondary | ICD-10-CM | POA: Diagnosis not present

## 2014-06-23 DIAGNOSIS — F329 Major depressive disorder, single episode, unspecified: Secondary | ICD-10-CM

## 2014-06-23 DIAGNOSIS — I10 Essential (primary) hypertension: Secondary | ICD-10-CM

## 2014-06-23 DIAGNOSIS — F32A Depression, unspecified: Secondary | ICD-10-CM

## 2014-06-23 DIAGNOSIS — K219 Gastro-esophageal reflux disease without esophagitis: Secondary | ICD-10-CM | POA: Diagnosis not present

## 2014-07-07 ENCOUNTER — Other Ambulatory Visit: Payer: Self-pay | Admitting: Internal Medicine

## 2014-07-07 DIAGNOSIS — M810 Age-related osteoporosis without current pathological fracture: Secondary | ICD-10-CM

## 2014-07-08 ENCOUNTER — Encounter: Payer: Self-pay | Admitting: *Deleted

## 2014-07-13 ENCOUNTER — Ambulatory Visit
Admission: RE | Admit: 2014-07-13 | Discharge: 2014-07-13 | Disposition: A | Discharge status: Home / Self Care | Payer: Medicaid Other | Source: Ambulatory Visit | Attending: Internal Medicine | Admitting: Internal Medicine

## 2014-07-13 DIAGNOSIS — M810 Age-related osteoporosis without current pathological fracture: Secondary | ICD-10-CM

## 2014-07-13 LAB — HM DEXA SCAN

## 2014-07-17 ENCOUNTER — Encounter: Payer: Self-pay | Admitting: *Deleted

## 2014-07-27 ENCOUNTER — Non-Acute Institutional Stay (SKILLED_NURSING_FACILITY): Payer: Medicare Other | Admitting: Internal Medicine

## 2014-07-27 ENCOUNTER — Encounter: Payer: Self-pay | Admitting: Internal Medicine

## 2014-07-27 DIAGNOSIS — F0150 Vascular dementia without behavioral disturbance: Secondary | ICD-10-CM | POA: Diagnosis not present

## 2014-07-27 DIAGNOSIS — F329 Major depressive disorder, single episode, unspecified: Secondary | ICD-10-CM

## 2014-07-27 DIAGNOSIS — I10 Essential (primary) hypertension: Secondary | ICD-10-CM | POA: Diagnosis not present

## 2014-07-27 DIAGNOSIS — K219 Gastro-esophageal reflux disease without esophagitis: Secondary | ICD-10-CM

## 2014-07-27 DIAGNOSIS — E1165 Type 2 diabetes mellitus with hyperglycemia: Secondary | ICD-10-CM | POA: Diagnosis not present

## 2014-07-27 DIAGNOSIS — Z794 Long term (current) use of insulin: Secondary | ICD-10-CM | POA: Diagnosis not present

## 2014-07-27 DIAGNOSIS — I6930 Unspecified sequelae of cerebral infarction: Secondary | ICD-10-CM | POA: Diagnosis not present

## 2014-07-27 DIAGNOSIS — E785 Hyperlipidemia, unspecified: Secondary | ICD-10-CM

## 2014-07-27 DIAGNOSIS — IMO0002 Reserved for concepts with insufficient information to code with codable children: Secondary | ICD-10-CM

## 2014-07-27 DIAGNOSIS — E1140 Type 2 diabetes mellitus with diabetic neuropathy, unspecified: Secondary | ICD-10-CM | POA: Diagnosis not present

## 2014-07-27 DIAGNOSIS — F32A Depression, unspecified: Secondary | ICD-10-CM

## 2014-07-27 NOTE — Progress Notes (Signed)
Patient ID: [name] [name], female   DOB: 12/27/1940, 74 y.o.   MRN: [id number]    DATE: 07/27/14  Location[facility]  [facility]    Place of Service: SNF (31)   Extended Emergency Contact Information Primary Emergency Contact: Wynn,Blondine Address: [address]          [place] Home Phone: [phone] Work Phone: [phone] Relation: Sister  Advanced Directive information  FULL CODE  Chief Complaint  Patient presents with  . Medical Management of Chronic Issues    HPI:  74 yo female long term resident seen today for f/u. She is a poor historian due to dementia. Hx obtained from chart. No nursing issues. No falls. She sleeps well. She had a DXA earlier this month that showed osteoporosis. She is on Vit D supplement  Dementia - stable on namenda xr. No recent behavioral issues  DM - CBGs 150-200s usually; occasionally 80-130s. No low BS reactions. She takes insulin (lantus and novolog)  Hyperlipidemia - controlled on lipitor. No myalgias  Hx CVA - stable. She takes lisinopril and statin  GERD/constipation - stable on miralax and zantac   Past Medical History  Diagnosis Date  . Diabetes mellitus   . Herpes simplex   . Hyperlipidemia   . Urinary tract infection   . Altered mental status   . Esophageal reflux   . Congenital coronary artery anomaly   . Cerebrovascular disease   . Depression   . Constipation   . Dementia   . Generalized abdominal pain     No past surgical history on file.  Patient Care Team: Kirt BoysMonica Markala Sitts, DO as PCP - General (Internal Medicine)  History   Social History  . Marital Status: Widowed    Spouse Name: N/A  . Number of Children: N/A  . Years of Education: N/A   Occupational History  . Not on file.   Social History Main Topics  . Smoking status: Former Games developermoker  . Smokeless tobacco: Not on file  . Alcohol Use: No  . Drug Use: No  . Sexual Activity: Not on file   Other Topics  Concern  . Not on file   Social History Narrative     reports that she has quit smoking. She does not have any smokeless tobacco history on file. She reports that she does not drink alcohol or use illicit drugs.  Immunization History  Administered Date(s) Administered  . Influenza Whole 12/23/2012  . Pneumococcal Polysaccharide-23 02/18/2009    No Known Allergies  Medications: Patient's Medications  New Prescriptions   No medications on file  Previous Medications   ASPIRIN 325 MG EC TABLET    Take 325 mg by mouth daily.   ATORVASTATIN (LIPITOR) 40 MG TABLET    Take 40 mg by mouth daily.   CHOLECALCIFEROL (VITAMIN D) 1000 UNITS TABLET    Take 2,000 Units by mouth daily.    INSULIN ASPART (NOVOLOG) 100 UNIT/ML INJECTION    Inject 5 Units into the skin 3 (three) times daily before meals. Only takes 5 units if cbg >150   INSULIN GLARGINE (LANTUS) 100 UNIT/ML INJECTION    Inject 0.2 mLs (20 Units total) into the skin at bedtime.   LISINOPRIL (PRINIVIL,ZESTRIL) 10 MG TABLET    Take 1 tablet (10 mg total) by mouth daily.   MEMANTINE HCL ER 14 MG CP24    Take 1 tablet (14 mg total) by mouth daily.   POLYETHYL GLYCOL-PROPYL GLYCOL (SYSTANE) 0.4-0.3 %  SOLN    Apply 1 drop to eye 3 (three) times daily.   POLYETHYLENE GLYCOL POWDER (GLYCOLAX/MIRALAX) POWDER    Take 17 g by mouth daily.   RANITIDINE (ZANTAC) 75 MG TABLET    Take 150 mg by mouth at bedtime.   SERTRALINE (ZOLOFT) 50 MG TABLET    Take 50 mg by mouth daily.  Modified Medications   No medications on file  Discontinued Medications   No medications on file    Review of Systems  Unable to perform ROS: Dementia    Filed Vitals:   07/27/14 1744  BP: 131/66  Pulse: 78  Temp: 97.6 F (36.4 C)  SpO2: 98%   There is no weight on file to calculate BMI.  Physical Exam  Constitutional: She appears well-developed and well-nourished.  Sitting in w/c in NAD.   HENT:  Mouth/Throat: Oropharynx is clear and moist. No  oropharyngeal exudate.  Eyes: Pupils are equal, round, and reactive to light. No scleral icterus.  Neck: Neck supple. Carotid bruit is not present. No tracheal deviation present.  Cardiovascular: Normal rate, regular rhythm, normal heart sounds and intact distal pulses.  Exam reveals no gallop and no friction rub.   No murmur heard. Trace pedal edema b/l. No calf TTP  Pulmonary/Chest: Effort normal and breath sounds normal. No stridor. No respiratory distress. She has no wheezes. She has no rales.  Abdominal: Soft. Bowel sounds are normal. She exhibits no distension and no mass. There is no hepatomegaly. There is no tenderness. There is no rebound and no guarding.  Musculoskeletal: She exhibits edema.  Lymphadenopathy:    She has no cervical adenopathy.  Neurological: She is alert.  Skin: Skin is warm and dry. No rash noted.  Psychiatric: She has a normal mood and affect. Her behavior is normal. Her speech is slurred.     Labs reviewed:  Lipid Panel     Component Value Date/Time   CHOL 127 05/14/2014   TRIG 76 05/14/2014   HDL 46 05/14/2014   CHOLHDL 2.8 03/07/2008 0445   VLDL 17 03/07/2008 0445   LDLCALC 65 05/14/2014    HgbA1c  8.2% (05/14/14)  Nursing Home on 04/04/2014  Component Date Value Ref Range Status  . Hemoglobin 03/09/2014 11.1* 12.0 - 16.0 g/dL Final  . HCT 09/81/191412/28/2015 35* 36 - 46 % Final  . Glucose 03/09/2014 198   Final  . BUN 03/09/2014 31* 4 - 21 mg/dL Final  . Creatinine 78/29/562112/28/2015 2.0* 0.5 - 1.1 mg/dL Final  . Potassium 30/86/578412/28/2015 4.4  3.4 - 5.3 mmol/L Final  . Sodium 03/09/2014 138  137 - 147 mmol/L Final    Dg Bone Density  07/13/2014   CLINICAL DATA:  74 year old postmenopausal black female with history of dementia, diabetes, gastroesophageal reflux. Patient does not definitely take calcium or vitamin-D supplementation. No bone building therapy. Baseline exam.  EXAM: DUAL X-RAY ABSORPTIOMETRY (DXA) FOR BONE MINERAL DENSITY  FINDINGS: AP LUMBAR SPINE   Bone Mineral Density (BMD):  0.996 g/cm2  Young Adult T-Score:  -0.5  Z-Score:  1.1  LEFT FEMUR NECK  Bone Mineral Density (BMD):  0.572 g/cm2  Young Adult T-Score: -2.5  Z-Score:  -1.2  ASSESSMENT: Patient's diagnostic category is OSTEOPOROSIS by WHO Criteria.  FRACTURE RISK: High  FRAX: World Health Organization FRAX assessment of absolute fracture risk is not calculated for this patient because the patient has osteoporosis.  COMPARISON: None.  Effective therapies are available in the form of bisphosphonates, selective estrogen receptor modulators, biologic agents,  and hormone replacement therapy (for women). All patients should ensure an adequate intake of dietary calcium (1200 mg daily) and vitamin D (800 IU daily) unless contraindicated.  All treatment decisions require clinical judgment and consideration of individual patient factors, including patient preferences, co-morbidities, previous drug use, risk factors not captured in the FRAX model (e.g., frailty, falls, vitamin D deficiency, increased bone turnover, interval significant decline in bone density) and possible under- or over-estimation of fracture risk by FRAX.  The National Osteoporosis Foundation recommends that FDA-approved medical therapies be considered in postmenopausal women and men age 42 or older with a:  1. Hip or vertebral (clinical or morphometric) fracture.  2. T-score of -2.5 or lower at the spine or hip.  3. Ten-year fracture probability by FRAX of 3% or greater for hip fracture or 20% or greater for major osteoporotic fracture.  People with diagnosed cases of osteoporosis or at high risk for fracture should have regular bone mineral density tests. For patients eligible for Medicare, routine testing is allowed once every 2 years. The testing frequency can be increased to one year for patients who have rapidly progressing disease, those who are receiving or discontinuing medical therapy to restore bone mass, or have additional risk  factors.  World Science writer [facility]) Criteria:  Normal: T-scores from +1.0 to -1.0  Low Bone Mass (Osteopenia): T-scores between -1.0 and -2.5  Osteoporosis: T-scores -2.5 and below  Comparison to Reference Population:  T-score is the key measure used in the diagnosis of osteoporosis and relative risk determination for fracture. It provides a value for bone mass relative to the mean bone mass of a young adult reference population expressed in terms of standard deviation (SD).  Z-score is the age-matched score showing the patient's values compared to a population matched for age, sex, and race. This is also expressed in terms of standard deviation. The patient may have values that compare favorably to the age-matched values and still be at increased risk for fracture.   Electronically Signed   By: Edwin Cap M.D.   On: 07/13/2014 13:55     Assessment/Plan   ICD-9-CM ICD-10-CM   1. Uncontrolled type 2 diabetes mellitus with diabetic neuropathy, with long-term current use of insulin (HCC) CBGs elevated most times 250.62 E11.40    357.2 Z79.4    V58.67 E11.65   2. Vascular dementia without behavioral disturbance - stable 290.40 F01.50   3. Essential hypertension, benign - controlled 401.1 I10   4. History of stroke with residual deficit - stable 438.9 I69.30   5. Dyslipidemia - controlled 272.4 E78.5   6. Depression - stable 311 F32.9   7. Gastroesophageal reflux disease, esophagitis presence not specified - stable 530.81 K21.9      --check CMP  --cont other meds as ordered  --Cont nutritional supplements as ordered  --CBG qAC  --PT/OT/ST as indicated  --will follow  [name] [name]  [facility] [address] [place], [place] [address] [phone] Cell (Monday-Friday 8 AM - 5 PM) [phone] After 5 PM and follow prompts

## 2014-07-28 ENCOUNTER — Other Ambulatory Visit: Payer: Self-pay | Admitting: Internal Medicine

## 2014-07-28 DIAGNOSIS — Z1231 Encounter for screening mammogram for malignant neoplasm of breast: Secondary | ICD-10-CM

## 2014-07-29 DIAGNOSIS — F0390 Unspecified dementia without behavioral disturbance: Secondary | ICD-10-CM | POA: Diagnosis not present

## 2014-07-29 DIAGNOSIS — R6889 Other general symptoms and signs: Secondary | ICD-10-CM | POA: Diagnosis not present

## 2014-07-29 DIAGNOSIS — Z79899 Other long term (current) drug therapy: Secondary | ICD-10-CM | POA: Diagnosis not present

## 2014-08-05 ENCOUNTER — Ambulatory Visit
Admission: RE | Admit: 2014-08-05 | Discharge: 2014-08-05 | Disposition: A | Discharge status: Home / Self Care | Payer: No Typology Code available for payment source | Source: Ambulatory Visit | Attending: Internal Medicine | Admitting: Internal Medicine

## 2014-08-05 DIAGNOSIS — Z1231 Encounter for screening mammogram for malignant neoplasm of breast: Secondary | ICD-10-CM | POA: Diagnosis not present

## 2014-08-06 NOTE — Progress Notes (Signed)
Patient ID: [name] [name], female   DOB: 1940-06-13, 74 y.o.   MRN: [id number]  [facility]     No Known Allergies    Chief Complaint  Patient presents with  . Annual Exam      HPI:  She is a long term resident of this facility being seen for her annual exam. She has remained stable over the past year; has not been hospitalized. She is not able to fully participate in the hpi or ros; but states that she is feeling good. There are no nursing concerns being voiced today.   Past Medical History  Diagnosis Date  . Diabetes mellitus   . Herpes simplex   . Hyperlipidemia   . Urinary tract infection   . Altered mental status   . Esophageal reflux   . Congenital coronary artery anomaly   . Cerebrovascular disease   . Depression   . Constipation   . Dementia   . Generalized abdominal pain     No past surgical history on file.  VITAL SIGNS BP 137/68 mmHg  Pulse 70  Ht  (1.626 m)  Wt 190 lb (86.183 kg)  BMI 32.60 kg/m2   Outpatient Encounter Prescriptions as of 06/23/2014  Medication Sig  . aspirin 325 MG EC tablet Take 325 mg by mouth daily.  Marland Kitchen atorvastatin (LIPITOR) 40 MG tablet Take 40 mg by mouth daily.  . cholecalciferol (VITAMIN D) 1000 UNITS tablet Take 2,000 Units by mouth daily.   . insulin aspart (NOVOLOG) 100 UNIT/ML injection Inject 5 Units into the skin 3 (three) times daily before meals. Only takes 5 units if cbg >150  . insulin glargine (LANTUS) 100 UNIT/ML injection Inject 0.2 mLs (20 Units total) into the skin at bedtime.  Marland Kitchen lisinopril (PRINIVIL,ZESTRIL) 10 MG tablet Take 1 tablet (10 mg total) by mouth daily.  . Memantine HCl ER 14 MG CP24 Take 1 tablet (14 mg total) by mouth daily.  Bertram Gala Glycol-Propyl Glycol (SYSTANE) 0.4-0.3 % SOLN Apply 1 drop to eye 3 (three) times daily.  . polyethylene glycol powder (GLYCOLAX/MIRALAX) powder Take 17 g by mouth daily.  . ranitidine (ZANTAC) 75 MG tablet Take 150 mg by mouth at bedtime.  . sertraline  (ZOLOFT) 50 MG tablet Take 50 mg by mouth daily.      SIGNIFICANT DIAGNOSTIC EXAMS   LABS REVIEWED:   09-18-13: wbc 5.1; hgb 11.3; hct 37.3; mcv 88.4; plt 275;glucose 210; bun 20.5; creat 0.88; k+4.7; na++144; chol 144; ldl 69; trig 96;  hgb a1c 8.1 09-19-13: urine micro-albumin 2.5 01-02-14: hgb a1c 7.8  02-22-14: wbc 5.4; hgb 11.1; hct 35.2 ;mcv 88.5; plt 161; glucose 198; bun 31; creat 1.98; k+4.4; na++138  05-14-14: chol 127; ldl 65; trig 76; hdl 46; hgb a1c 8.2    ROS Unable to perform ROS     Physical Exam Constitutional: She appears well-developed and well-nourished. No distress.  Neck: Neck supple. No JVD present. No thyromegaly present.  Breast exam is normal  Cardiovascular: Normal rate, regular rhythm and intact distal pulses.   Respiratory: Effort normal and breath sounds normal. No respiratory distress.  GI: Soft. Bowel sounds are normal. She exhibits no distension. There is no tenderness.  Musculoskeletal: She exhibits no edema.  Is able to move all extremities   Neurological: She is alert.  Skin: Skin is warm and dry. She is not diaphoretic.     ASSESSMENT/ PLAN:  1.  Dyslipidemia: her ldl is 65; will continue lipitor 40 mg daily and  will monitor her status   2. Diabetes: her hgb a1c is 8.2; will continue lantus 20 units daily and will continue novolog 5 units prior to meals for cbg >=150 will monitor   3. Hypertension: is stable will continue lisinopril 10 mg daily; her urine micro-albumin is 2.5. Will not make changes will monitor her status.   4. CVA: she is neurologically stable will continue her asa 325 mg daily and will monitor   5. Gerd: will continue zantac 150 mg daily   6. Vascular dementia: no significant change in her status; will  namenda xr 14 mg daily will monitor; her current weight is 190 pounds.   7. Depression: she is benefiting from zoloft 50 mg daily will make changes will monitor     Will setup screening mammogram; dexa scan; will  guaiac stool X3;    Synthia Innocenteborah Oather [place] NP [facility]  Contact [phone] Monday through Friday 8am- 5pm  After hours call [phone]

## 2014-09-02 ENCOUNTER — Non-Acute Institutional Stay (SKILLED_NURSING_FACILITY): Payer: Medicare Other | Admitting: Adult Health

## 2014-09-02 DIAGNOSIS — I10 Essential (primary) hypertension: Secondary | ICD-10-CM | POA: Diagnosis not present

## 2014-09-02 DIAGNOSIS — E1149 Type 2 diabetes mellitus with other diabetic neurological complication: Secondary | ICD-10-CM

## 2014-09-02 DIAGNOSIS — I6930 Unspecified sequelae of cerebral infarction: Secondary | ICD-10-CM | POA: Diagnosis not present

## 2014-09-02 DIAGNOSIS — F329 Major depressive disorder, single episode, unspecified: Secondary | ICD-10-CM

## 2014-09-02 DIAGNOSIS — F0150 Vascular dementia without behavioral disturbance: Secondary | ICD-10-CM | POA: Diagnosis not present

## 2014-09-02 DIAGNOSIS — E785 Hyperlipidemia, unspecified: Secondary | ICD-10-CM

## 2014-09-02 DIAGNOSIS — M810 Age-related osteoporosis without current pathological fracture: Secondary | ICD-10-CM | POA: Diagnosis not present

## 2014-09-02 DIAGNOSIS — K5901 Slow transit constipation: Secondary | ICD-10-CM | POA: Diagnosis not present

## 2014-09-02 DIAGNOSIS — K219 Gastro-esophageal reflux disease without esophagitis: Secondary | ICD-10-CM

## 2014-09-02 DIAGNOSIS — F32A Depression, unspecified: Secondary | ICD-10-CM

## 2014-09-05 DIAGNOSIS — E119 Type 2 diabetes mellitus without complications: Secondary | ICD-10-CM | POA: Diagnosis not present

## 2014-09-05 DIAGNOSIS — R7989 Other specified abnormal findings of blood chemistry: Secondary | ICD-10-CM | POA: Diagnosis not present

## 2014-09-08 DIAGNOSIS — E119 Type 2 diabetes mellitus without complications: Secondary | ICD-10-CM | POA: Diagnosis not present

## 2014-09-08 DIAGNOSIS — B351 Tinea unguium: Secondary | ICD-10-CM | POA: Diagnosis not present

## 2014-09-08 DIAGNOSIS — M79675 Pain in left toe(s): Secondary | ICD-10-CM | POA: Diagnosis not present

## 2014-09-08 DIAGNOSIS — M79674 Pain in right toe(s): Secondary | ICD-10-CM | POA: Diagnosis not present

## 2014-09-24 DIAGNOSIS — E118 Type 2 diabetes mellitus with unspecified complications: Secondary | ICD-10-CM | POA: Diagnosis not present

## 2014-10-06 ENCOUNTER — Non-Acute Institutional Stay (SKILLED_NURSING_FACILITY): Payer: Medicare Other | Admitting: Adult Health

## 2014-10-06 DIAGNOSIS — M810 Age-related osteoporosis without current pathological fracture: Secondary | ICD-10-CM

## 2014-10-06 DIAGNOSIS — F0150 Vascular dementia without behavioral disturbance: Secondary | ICD-10-CM | POA: Diagnosis not present

## 2014-10-06 DIAGNOSIS — K219 Gastro-esophageal reflux disease without esophagitis: Secondary | ICD-10-CM

## 2014-10-06 DIAGNOSIS — K5901 Slow transit constipation: Secondary | ICD-10-CM

## 2014-10-06 DIAGNOSIS — I10 Essential (primary) hypertension: Secondary | ICD-10-CM | POA: Diagnosis not present

## 2014-10-06 DIAGNOSIS — E785 Hyperlipidemia, unspecified: Secondary | ICD-10-CM

## 2014-10-06 DIAGNOSIS — E1149 Type 2 diabetes mellitus with other diabetic neurological complication: Secondary | ICD-10-CM

## 2014-10-07 DIAGNOSIS — Z79899 Other long term (current) drug therapy: Secondary | ICD-10-CM | POA: Diagnosis not present

## 2014-10-07 DIAGNOSIS — E118 Type 2 diabetes mellitus with unspecified complications: Secondary | ICD-10-CM | POA: Diagnosis not present

## 2014-10-07 DIAGNOSIS — I10 Essential (primary) hypertension: Secondary | ICD-10-CM | POA: Diagnosis not present

## 2014-10-07 DIAGNOSIS — D649 Anemia, unspecified: Secondary | ICD-10-CM | POA: Diagnosis not present

## 2014-10-09 DIAGNOSIS — E118 Type 2 diabetes mellitus with unspecified complications: Secondary | ICD-10-CM | POA: Diagnosis not present

## 2014-10-09 DIAGNOSIS — D638 Anemia in other chronic diseases classified elsewhere: Secondary | ICD-10-CM | POA: Diagnosis not present

## 2014-10-29 ENCOUNTER — Non-Acute Institutional Stay (SKILLED_NURSING_FACILITY): Payer: Medicare Other | Admitting: Internal Medicine

## 2014-10-29 ENCOUNTER — Emergency Department (HOSPITAL_COMMUNITY)
Admission: EM | Admit: 2014-10-29 | Discharge: 2014-10-29 | Disposition: A | Discharge status: Home / Self Care | Payer: Medicaid Other | Attending: Emergency Medicine | Admitting: Emergency Medicine

## 2014-10-29 ENCOUNTER — Encounter (HOSPITAL_COMMUNITY): Payer: Self-pay

## 2014-10-29 ENCOUNTER — Emergency Department (HOSPITAL_COMMUNITY): Payer: Medicaid Other

## 2014-10-29 DIAGNOSIS — Z87891 Personal history of nicotine dependence: Secondary | ICD-10-CM | POA: Insufficient documentation

## 2014-10-29 DIAGNOSIS — Z87440 Personal history of urinary (tract) infections: Secondary | ICD-10-CM | POA: Insufficient documentation

## 2014-10-29 DIAGNOSIS — I10 Essential (primary) hypertension: Secondary | ICD-10-CM

## 2014-10-29 DIAGNOSIS — J069 Acute upper respiratory infection, unspecified: Secondary | ICD-10-CM | POA: Diagnosis not present

## 2014-10-29 DIAGNOSIS — I6930 Unspecified sequelae of cerebral infarction: Secondary | ICD-10-CM

## 2014-10-29 DIAGNOSIS — I959 Hypotension, unspecified: Secondary | ICD-10-CM

## 2014-10-29 DIAGNOSIS — E785 Hyperlipidemia, unspecified: Secondary | ICD-10-CM | POA: Insufficient documentation

## 2014-10-29 DIAGNOSIS — F0390 Unspecified dementia without behavioral disturbance: Secondary | ICD-10-CM | POA: Diagnosis not present

## 2014-10-29 DIAGNOSIS — Z7982 Long term (current) use of aspirin: Secondary | ICD-10-CM | POA: Diagnosis not present

## 2014-10-29 DIAGNOSIS — E119 Type 2 diabetes mellitus without complications: Secondary | ICD-10-CM | POA: Insufficient documentation

## 2014-10-29 DIAGNOSIS — Z8673 Personal history of transient ischemic attack (TIA), and cerebral infarction without residual deficits: Secondary | ICD-10-CM | POA: Insufficient documentation

## 2014-10-29 DIAGNOSIS — R001 Bradycardia, unspecified: Secondary | ICD-10-CM

## 2014-10-29 DIAGNOSIS — R4182 Altered mental status, unspecified: Secondary | ICD-10-CM | POA: Diagnosis not present

## 2014-10-29 DIAGNOSIS — K5900 Constipation, unspecified: Secondary | ICD-10-CM | POA: Insufficient documentation

## 2014-10-29 DIAGNOSIS — R531 Weakness: Secondary | ICD-10-CM | POA: Diagnosis not present

## 2014-10-29 DIAGNOSIS — K219 Gastro-esophageal reflux disease without esophagitis: Secondary | ICD-10-CM | POA: Diagnosis not present

## 2014-10-29 DIAGNOSIS — R031 Nonspecific low blood-pressure reading: Secondary | ICD-10-CM | POA: Diagnosis not present

## 2014-10-29 DIAGNOSIS — F0150 Vascular dementia without behavioral disturbance: Secondary | ICD-10-CM

## 2014-10-29 DIAGNOSIS — R7309 Other abnormal glucose: Secondary | ICD-10-CM

## 2014-10-29 DIAGNOSIS — Z79899 Other long term (current) drug therapy: Secondary | ICD-10-CM | POA: Insufficient documentation

## 2014-10-29 DIAGNOSIS — Z8619 Personal history of other infectious and parasitic diseases: Secondary | ICD-10-CM | POA: Insufficient documentation

## 2014-10-29 DIAGNOSIS — Q245 Malformation of coronary vessels: Secondary | ICD-10-CM | POA: Insufficient documentation

## 2014-10-29 DIAGNOSIS — Z794 Long term (current) use of insulin: Secondary | ICD-10-CM | POA: Insufficient documentation

## 2014-10-29 DIAGNOSIS — F329 Major depressive disorder, single episode, unspecified: Secondary | ICD-10-CM | POA: Insufficient documentation

## 2014-10-29 LAB — I-STAT CHEM 8, ED
BUN: 26 mg/dL — AB (ref 6–20)
Calcium, Ion: 1.14 mmol/L (ref 1.13–1.30)
Chloride: 108 mmol/L (ref 101–111)
Creatinine, Ser: 1.2 mg/dL — ABNORMAL HIGH (ref 0.44–1.00)
Glucose, Bld: 164 mg/dL — ABNORMAL HIGH (ref 65–99)
HEMATOCRIT: 38 % (ref 36.0–46.0)
HEMOGLOBIN: 12.9 g/dL (ref 12.0–15.0)
POTASSIUM: 4.9 mmol/L (ref 3.5–5.1)
SODIUM: 143 mmol/L (ref 135–145)
TCO2: 23 mmol/L (ref 0–100)

## 2014-10-29 LAB — CBG MONITORING, ED: GLUCOSE-CAPILLARY: 134 mg/dL — AB (ref 65–99)

## 2014-10-29 LAB — I-STAT TROPONIN, ED: TROPONIN I, POC: 0 ng/mL (ref 0.00–0.08)

## 2014-10-29 NOTE — ED Notes (Signed)
Pt cbg 134

## 2014-10-29 NOTE — ED Notes (Signed)
[facility] has been called  

## 2014-10-29 NOTE — ED Notes (Signed)
Xray at the bedside.

## 2014-10-29 NOTE — ED Notes (Signed)
Per EMS: Pt from [facility]. Staff at facility took [facility] and found it to be 167, called for transport to r/o MI. Pt denies any pain/pressure/sob. Pt hx of alzheimers, is at baseline. States "I'm cold."

## 2014-10-29 NOTE — ED Provider Notes (Signed)
CSN: [id number]     Arrival date & time 10/29/14  1235 History   First MD Initiated Contact with Patient 10/29/14 1244     Chief Complaint  Patient presents with  . Follow-up     (Consider location/radiation/quality/duration/timing/severity/associated sxs/prior Treatment) Patient is a 74 y.o. female presenting with weakness. The history is provided by the nursing home (pt had an elevated glucose at the [facility]  and she was sent over here to rule out mi.  pt has no complaints now).  Weakness This is a recurrent problem. The current episode started 6 to 12 hours ago. The problem occurs constantly. The problem has not changed since onset.Pertinent negatives include no chest pain and no abdominal pain. Nothing aggravates the symptoms. Nothing relieves the symptoms.    Past Medical History  Diagnosis Date  . Diabetes mellitus   . Herpes simplex   . Hyperlipidemia   . Urinary tract infection   . Altered mental status   . Esophageal reflux   . Congenital coronary artery anomaly   . Cerebrovascular disease   . Depression   . Constipation   . Dementia   . Generalized abdominal pain    History reviewed. No pertinent past surgical history. History reviewed. No pertinent family history. Social History  Substance Use Topics  . Smoking status: Former Games developer  . Smokeless tobacco: None  . Alcohol Use: No   OB History    No data available     Review of Systems  Unable to perform ROS: Dementia  Cardiovascular: Negative for chest pain.  Gastrointestinal: Negative for abdominal pain.  Neurological: Positive for weakness.      Allergies  Review of patient's allergies indicates no known allergies.  Home Medications   Prior to Admission medications   Medication Sig Start Date End Date Taking? Authorizing Provider  aspirin 325 MG EC tablet Take 325 mg by mouth daily.    Historical Provider, MD  atorvastatin (LIPITOR) 40 MG tablet Take 40 mg by mouth daily.    Historical Provider, MD   cholecalciferol (VITAMIN D) 1000 UNITS tablet Take 2,000 Units by mouth daily.     Historical Provider, MD  insulin aspart (NOVOLOG) 100 UNIT/ML injection Inject 5 Units into the skin 3 (three) times daily before meals. Only takes 5 units if cbg >150    Historical Provider, MD  insulin glargine (LANTUS) 100 UNIT/ML injection Inject 0.2 mLs (20 Units total) into the skin at bedtime. 12/31/13   Tiffany L Reed, DO  lisinopril (PRINIVIL,ZESTRIL) 10 MG tablet Take 1 tablet (10 mg total) by mouth daily. 09/17/13   Tiffany L Reed, DO  Memantine HCl ER 14 MG CP24 Take 1 tablet (14 mg total) by mouth daily. 02/25/14   Sharee Holster, NP  Polyethyl Glycol-Propyl Glycol (SYSTANE) 0.4-0.3 % SOLN Apply 1 drop to eye 3 (three) times daily.    Historical Provider, MD  polyethylene glycol powder (GLYCOLAX/MIRALAX) powder Take 17 g by mouth daily. 02/25/14   Sharee Holster, NP  ranitidine (ZANTAC) 75 MG tablet Take 150 mg by mouth at bedtime.    Historical Provider, MD  sertraline (ZOLOFT) 50 MG tablet Take 50 mg by mouth daily.    Historical Provider, MD   BP 121/58 mmHg  Pulse 55  Temp(Src) 97.6 F (36.4 C) (Oral)  Resp 16  SpO2 98% Physical Exam  Constitutional: She appears well-developed.  HENT:  Head: Normocephalic.  Eyes: Conjunctivae and EOM are normal. No scleral icterus.  Neck: Neck supple. No thyromegaly  present.  Cardiovascular: Normal rate and regular rhythm.  Exam reveals no gallop and no friction rub.   No murmur heard. Pulmonary/Chest: No stridor. She has no wheezes. She has no rales. She exhibits no tenderness.  Abdominal: She exhibits no distension. There is no tenderness. There is no rebound.  Musculoskeletal: Normal range of motion. She exhibits no edema.  Lymphadenopathy:    She has no cervical adenopathy.  Neurological: She is alert. She exhibits normal muscle tone. Coordination normal.  Oriented to person only  Skin: No rash noted. No erythema.  Psychiatric: She has a normal  mood and affect. Her behavior is normal.    ED Course  Procedures (including critical care time) Labs Review Labs Reviewed  CBG MONITORING, ED - Abnormal; Notable for the following:    Glucose-Capillary 134 (*)    All other components within normal limits  I-STAT CHEM 8, ED - Abnormal; Notable for the following:    BUN 26 (*)    Creatinine, Ser 1.20 (*)    Glucose, Bld 164 (*)    All other components within normal limits  Rosezena Sensor, ED    Imaging Review Dg Chest Port 1 View  10/29/2014   CLINICAL DATA:  Upper respiratory infection.  Diabetes.  Dementia.  EXAM: PORTABLE CHEST - 1 VIEW  COMPARISON:  01/17/2010  FINDINGS: Atherosclerotic aortic arch.  Heart size within normal limits.  The lungs appear clear.  No pleural effusion identified.  IMPRESSION: 1. No acute findings. 2. Atherosclerotic aortic arch.   Electronically Signed   By: Gaylyn Rong M.D.   On: 10/29/2014 13:14   I have personally reviewed and evaluated these images and lab results as part of my medical decision-making.   EKG Interpretation   Date/Time:  Thursday October 29 2014 12:38:00 EDT Ventricular Rate:  53 PR Interval:  152 QRS Duration: 74 QT Interval:  483 QTC Calculation: 453 R Axis:   17 Text Interpretation:  Sinus rhythm Confirmed by Blue Ruggerio  MD, Jacaria Colburn [phone])  on 10/29/2014 3:15:56 PM      MDM   Final diagnoses:  None    Mildly elevated glucose,  O/w labs unremarkable,  No evidence of mI,  tx no change in diabetes tx,  Follow up with pcp    Bethann Berkshire, MD 10/29/14 1517

## 2014-10-29 NOTE — Discharge Instructions (Signed)
Follow up with your md as needed °

## 2014-10-29 NOTE — Progress Notes (Signed)
Patient ID: [name] [name], female   DOB: 1940/06/08, 74 y.o.   MRN: [id number]    DATE:10/29/14  Location:  [facility]    Place of Service: SNF (31)   Extended Emergency Contact Information Primary Emergency Contact: Wynn,Blondine Address: [address]          [place] Home Phone: [phone] Work Phone: [phone] Relation: Sister  Advanced Directive information  FULL CODE  Chief Complaint  Patient presents with  . Acute Visit    HPI:  74 yo female long term resident seen today for acute onset increased salivation, diaphoresis and reporting that she is not feeling well. Denies CP/tightness and no SOB. No f/c. No HA or dizziness. She has a hx CVA, HTN, DM. She is a poor historian due to dementia. Hx obtained from chart and nursing  Dementia - stable on namenda xr. No recent behavioral issues  DM - CBG 147 today.  No low BS reactions. She takes insulin (lantus and novolog)  Hyperlipidemia - controlled on lipitor. No myalgias  Hx CVA/HTN - stable. She takes lisinopril and statin  Past Medical History  Diagnosis Date  . Diabetes mellitus   . Herpes simplex   . Hyperlipidemia   . Urinary tract infection   . Altered mental status   . Esophageal reflux   . Congenital coronary artery anomaly   . Cerebrovascular disease   . Depression   . Constipation   . Dementia   . Generalized abdominal pain     No past surgical history on file.  Patient Care Team: Margit Hanks, MD as PCP - General (Internal Medicine) Sharee Holster, NP as Nurse Practitioner (Geriatric Medicine) [facility] (Skilled Nursing Facility)  Social History   Social History  . Marital Status: Widowed    Spouse Name: N/A  . Number of Children: N/A  . Years of Education: N/A   Occupational History  . Not on file.   Social History Main Topics  . Smoking status: Former Games developer  . Smokeless tobacco: Not on file  .  Alcohol Use: No  . Drug Use: No  . Sexual Activity: Not on file   Other Topics Concern  . Not on file   Social History Narrative     reports that she has quit smoking. She does not have any smokeless tobacco history on file. She reports that she does not drink alcohol or use illicit drugs.  Immunization History  Administered Date(s) Administered  . Influenza Whole 12/23/2012  . Pneumococcal Polysaccharide-23 02/18/2009    No Known Allergies  Medications: Patient's Medications  New Prescriptions   MEMANTINE HCL ER (NAMENDA XR) 21 MG CP24    Take 21 mg by mouth daily.  Previous Medications   ASPIRIN 325 MG EC TABLET    Take 325 mg by mouth daily.   CALCIUM CARBONATE (TUMS - DOSED IN MG ELEMENTAL CALCIUM) 500 MG CHEWABLE TABLET    Chew 1 tablet by mouth 2 (two) times daily.   CHOLECALCIFEROL (VITAMIN D) 1000 UNITS TABLET    Take 1,000 Units by mouth daily.    DONEPEZIL (ARICEPT) 10 MG TABLET    Take 5 mg by mouth at bedtime.    INSULIN ASPART (NOVOLOG) 100 UNIT/ML INJECTION    Inject 5 Units into the skin 3 (three) times daily before meals. Only takes 5 units if cbg >150   INSULIN GLARGINE (LANTUS) 100 UNIT/ML INJECTION    Inject 0.2  mLs (20 Units total) into the skin at bedtime.   LISINOPRIL (PRINIVIL,ZESTRIL) 10 MG TABLET    Take 1 tablet (10 mg total) by mouth daily.   POLYETHYL GLYCOL-PROPYL GLYCOL (SYSTANE) 0.4-0.3 % SOLN    Apply 1 drop to eye 3 (three) times daily.   POLYETHYLENE GLYCOL POWDER (GLYCOLAX/MIRALAX) POWDER    Take 17 g by mouth daily.   RANITIDINE (ZANTAC) 75 MG TABLET    Take 150 mg by mouth at bedtime.  Modified Medications   Modified Medication Previous Medication   SERTRALINE (ZOLOFT) 50 MG TABLET sertraline (ZOLOFT) 50 MG tablet      Take 1.5 tablets (75 mg total) by mouth daily.    Take 50 mg by mouth daily.  Discontinued Medications   ATORVASTATIN (LIPITOR) 40 MG TABLET    Take 40 mg by mouth daily.   MEMANTINE HCL ER 14 MG CP24    Take 1 tablet (14 mg  total) by mouth daily.    Review of Systems  Unable to perform ROS: Dementia    Filed Vitals:   10/29/14 1538  BP: 82/41  Pulse: 52  SpO2: 95%   There is no weight on file to calculate BMI.  Physical Exam  Constitutional: She appears well-developed. She has a sickly appearance.  Clammy, frail appearing. No conversational dyspnea  HENT:  Mouth/Throat: Oropharynx is clear and moist. No oropharyngeal exudate.  Eyes: Pupils are equal, round, and reactive to light. No scleral icterus.  Neck: Neck supple.  Cardiovascular: Bradycardia present.  Exam reveals no gallop and no friction rub.   No murmur heard. HR at 42 bpm. No LE edema b/l. No calf TTP  Pulmonary/Chest: Effort normal and breath sounds normal. No accessory muscle usage. No respiratory distress. She has no decreased breath sounds. She has no wheezes. She has no rhonchi. She has no rales. She exhibits tenderness (CP reproducible).  Lymphadenopathy:    She has no cervical adenopathy.  Neurological: She is alert.  Skin: No rash noted.  Psychiatric: She has a normal mood and affect. Her behavior is normal.     Labs reviewed: Admission on 10/29/2014, Discharged on 10/29/2014  Component Date Value Ref Range Status  . Glucose-Capillary 10/29/2014 134* 65 - 99 mg/dL Final  . Troponin i, poc 10/29/2014 0.00  0.00 - 0.08 ng/mL Final  . Comment 3 10/29/2014          Final   Comment: Due to the release kinetics of cTnI, a negative result within the first hours of the onset of symptoms does not rule out myocardial infarction with certainty. If myocardial infarction is still suspected, repeat the test at appropriate intervals.   . Sodium 10/29/2014 143  135 - 145 mmol/L Final  . Potassium 10/29/2014 4.9  3.5 - 5.1 mmol/L Final  . Chloride 10/29/2014 108  101 - 111 mmol/L Final  . BUN 10/29/2014 26* 6 - 20 mg/dL Final  . Creatinine, Ser 10/29/2014 1.20* 0.44 - 1.00 mg/dL Final  . Glucose, Bld 78/29/5621 164* 65 - 99 mg/dL  Final  . Calcium, Ion 10/29/2014 1.14  1.13 - 1.30 mmol/L Final  . TCO2 10/29/2014 23  0 - 100 mmol/L Final  . Hemoglobin 10/29/2014 12.9  12.0 - 15.0 g/dL Final  . HCT 30/86/5784 38.0  36.0 - 46.0 % Final    Dg Chest Port 1 View  10/29/2014   CLINICAL DATA:  Upper respiratory infection.  Diabetes.  Dementia.  EXAM: PORTABLE CHEST - 1 VIEW  COMPARISON:  01/17/2010  FINDINGS: Atherosclerotic aortic arch.  Heart size within normal limits.  The lungs appear clear.  No pleural effusion identified.  IMPRESSION: 1. No acute findings. 2. Atherosclerotic aortic arch.   Electronically Signed   By: Gaylyn Rong M.D.   On: 10/29/2014 13:14     Assessment/Plan   ICD-9-CM ICD-10-CM   1. Bradycardia with 41 - 50 beats per minute 427.89 R00.1   2. Altered mental status, unspecified altered mental status type 780.97 R41.82   3. History of stroke with residual deficit 438.9 I69.30   4. Essential hypertension, benign 401.1 I10   5. Vascular dementia without behavioral disturbance 290.40 F01.50   6.      Hypotension  Due to acuity of condition, pt sent to ED via EMS - need to r/o CVA, MI  Will follow after d/c  Javarri Segal S. Ancil Linsey  [facility] [address] [place], [place] [address] [phone] Cell (Monday-Friday 8 AM - 5 PM) [phone] After 5 PM and follow prompts

## 2014-11-10 ENCOUNTER — Encounter: Payer: Self-pay | Admitting: Adult Health

## 2014-11-10 DIAGNOSIS — M810 Age-related osteoporosis without current pathological fracture: Secondary | ICD-10-CM | POA: Insufficient documentation

## 2014-11-10 NOTE — Progress Notes (Signed)
Patient ID: [name] [name], female   DOB: 1940/07/03, 74 y.o.   MRN: [id number]   Facility: [facility]      No Known Allergies  Chief Complaint  Patient presents with  . Medical Management of Chronic Issues    HPI:  She is a long term resident of this facility being seen for the management of her chronic illnesses. Overall there is little change in her status. She is unable to fully participate in the hpi or ros.  There are no nursing concerns at this time.    Past Medical History  Diagnosis Date  . Diabetes mellitus   . Herpes simplex   . Hyperlipidemia   . Urinary tract infection   . Altered mental status   . Esophageal reflux   . Congenital coronary artery anomaly   . Cerebrovascular disease   . Depression   . Constipation   . Dementia   . Generalized abdominal pain     No past surgical history on file.  VITAL SIGNS BP 142/78 mmHg  Pulse 84  Ht  (1.626 m)  Wt 189 lb (85.73 kg)  BMI 32.43 kg/m2  SpO2 97%  Patient's Medications  New Prescriptions   No medications on file  Previous Medications   ALENDRONATE (FOSAMAX) 70 MG TABLET    Take 70 mg by mouth once a week. Take with a full glass of water on an empty stomach.   ASPIRIN 325 MG EC TABLET    Take 325 mg by mouth daily.   ATORVASTATIN (LIPITOR) 40 MG TABLET    Take 40 mg by mouth daily.   CHOLECALCIFEROL (VITAMIN D) 1000 UNITS TABLET    Take 2,000 Units by mouth daily.    DONEPEZIL (ARICEPT) 10 MG TABLET    Take 10 mg by mouth at bedtime.   INSULIN ASPART (NOVOLOG) 100 UNIT/ML INJECTION    Inject 5 Units into the skin 3 (three) times daily before meals. Only takes 5 units if cbg >150   INSULIN GLARGINE (LANTUS) 100 UNIT/ML INJECTION    Inject 0.2 mLs (20 Units total) into the skin at bedtime.   LISINOPRIL (PRINIVIL,ZESTRIL) 10 MG TABLET    Take 1 tablet (10 mg total) by mouth daily.   MEMANTINE HCL ER 14 MG CP24    Take 1 tablet (14 mg total) by mouth daily.   POLYETHYL GLYCOL-PROPYL  GLYCOL (SYSTANE) 0.4-0.3 % SOLN    Apply 1 drop to eye 3 (three) times daily.   POLYETHYLENE GLYCOL POWDER (GLYCOLAX/MIRALAX) POWDER    Take 17 g by mouth daily.   RANITIDINE (ZANTAC) 75 MG TABLET    Take 150 mg by mouth at bedtime.   SERTRALINE (ZOLOFT) 50 MG TABLET    Take 50 mg by mouth daily.  Modified Medications   No medications on file  Discontinued Medications   No medications on file     SIGNIFICANT DIAGNOSTIC EXAMS  07-13-14: dexa: t score -2.5  08-05-14: mammogram: No mammographic evidence of malignancy. A result letter of this screening mammogram will be mailed directly to the patient.     LABS REVIEWED:   09-18-13: wbc 5.1; hgb 11.3; hct 37.3; mcv 88.4; plt 275;glucose 210; bun 20.5; creat 0.88; k+4.7; na++144; chol 144; ldl 69; trig 96;  hgb a1c 8.1 09-19-13: urine micro-albumin 2.5 01-02-14: hgb a1c 7.8  02-22-14: wbc 5.4; hgb 11.1; hct 35.2 ;mcv 88.5; plt 161; glucose 198; bun 31; creat 1.98; k+4.4; na++138  05-14-14: chol 127; ldl 65; trig 76;  hdl 46; hgb a1c 8.2  07-29-14: glucose 88; bun 14.9; creat 0.86; k+4.5; na++141; liver normal albumin 3.6     Review of Systems  Unable to perform ROS: Dementia      Physical Exam  Constitutional: No distress.  Eyes: Conjunctivae are normal.  Neck: Neck supple. No JVD present. No thyromegaly present.  Cardiovascular: Normal rate, regular rhythm and intact distal pulses.   Respiratory: Effort normal and breath sounds normal. No respiratory distress. She has no wheezes.  GI: Soft. Bowel sounds are normal. She exhibits no distension. There is no tenderness.  Musculoskeletal: She exhibits no edema.  Able to move all extremities   Lymphadenopathy:    She has no cervical adenopathy.  Neurological: She is alert.  Skin: Skin is warm and dry. She is not diaphoretic.  Psychiatric: She has a normal mood and affect.       ASSESSMENT/ PLAN:  1.  Dyslipidemia: her ldl is 65; will continue lipitor 40 mg daily and will monitor  her status   2. Diabetes: her hgb a1c is 8.2; will continue lantus 20 units daily and will continue novolog 5 units prior to meals for cbg >=150 will monitor   3. Hypertension: is stable will continue lisinopril 10 mg daily; her urine micro-albumin is 2.5. Will not make changes will monitor her status.   4. CVA: she is neurologically stable will continue her asa 325 mg daily and will monitor   5. Gerd: will continue zantac 150 mg daily   6. Vascular dementia: no significant change in her status; will  namenda xr 14 mg daily will monitor; her current weight is 189 pounds and is stable  7. Depression: she is benefiting from zoloft 50 mg daily will make changes will monitor   8. Osteoporosis: will continue fosamax 70 mg weekly on vit d 2000 units daily    Will check hgb a1c     Synthia Innocent NP [facility]  Contact [phone] Monday through Friday 8am- 5pm  After hours call [phone]

## 2014-11-11 ENCOUNTER — Non-Acute Institutional Stay (SKILLED_NURSING_FACILITY): Payer: Medicare Other | Admitting: Adult Health

## 2014-11-11 DIAGNOSIS — F0150 Vascular dementia without behavioral disturbance: Secondary | ICD-10-CM | POA: Diagnosis not present

## 2014-11-11 DIAGNOSIS — M810 Age-related osteoporosis without current pathological fracture: Secondary | ICD-10-CM

## 2014-11-11 DIAGNOSIS — K219 Gastro-esophageal reflux disease without esophagitis: Secondary | ICD-10-CM

## 2014-11-11 DIAGNOSIS — D638 Anemia in other chronic diseases classified elsewhere: Secondary | ICD-10-CM

## 2014-11-11 DIAGNOSIS — I10 Essential (primary) hypertension: Secondary | ICD-10-CM

## 2014-11-11 DIAGNOSIS — K5901 Slow transit constipation: Secondary | ICD-10-CM

## 2014-11-11 DIAGNOSIS — E1149 Type 2 diabetes mellitus with other diabetic neurological complication: Secondary | ICD-10-CM | POA: Diagnosis not present

## 2014-11-11 DIAGNOSIS — E785 Hyperlipidemia, unspecified: Secondary | ICD-10-CM | POA: Diagnosis not present

## 2014-11-12 DIAGNOSIS — D649 Anemia, unspecified: Secondary | ICD-10-CM | POA: Diagnosis not present

## 2014-11-12 DIAGNOSIS — E118 Type 2 diabetes mellitus with unspecified complications: Secondary | ICD-10-CM | POA: Diagnosis not present

## 2014-12-03 ENCOUNTER — Encounter: Payer: Self-pay | Admitting: Adult Health

## 2014-12-03 DIAGNOSIS — D638 Anemia in other chronic diseases classified elsewhere: Secondary | ICD-10-CM | POA: Insufficient documentation

## 2014-12-03 NOTE — Progress Notes (Signed)
Patient ID: [name] [name], female   DOB: 1940-08-20, 73 y.o.   MRN: [id number]    Facility: [facility]      No Known Allergies  Chief Complaint  Patient presents with  . Medical Management of Chronic Issues    HPI:  She is a long term resident of this facility being seen for the management of her chronic illnesses.  overall her status remains stable. She is unable to fully participate in the hpi or ros; but states that she feels good. There are no nursing concerns.     Past Medical History  Diagnosis Date  . Diabetes mellitus   . Herpes simplex   . Hyperlipidemia   . Urinary tract infection   . Altered mental status   . Esophageal reflux   . Congenital coronary artery anomaly   . Cerebrovascular disease   . Depression   . Constipation   . Dementia   . Generalized abdominal pain     No past surgical history on file.  VITAL SIGNS BP 112/76 mmHg  Pulse 68  Ht  (1.626 m)  Wt 180 lb (81.647 kg)  BMI 30.88 kg/m2  Patient's Medications  New Prescriptions   No medications on file  Previous Medications   ALENDRONATE (FOSAMAX) 70 MG TABLET    Take 70 mg by mouth once a week. Take with a full glass of water on an empty stomach.   ASPIRIN 325 MG EC TABLET    Take 325 mg by mouth daily.   ATORVASTATIN (LIPITOR) 40 MG TABLET    Take 40 mg by mouth daily.   CHOLECALCIFEROL (VITAMIN D) 1000 UNITS TABLET    Take 2,000 Units by mouth daily.    DONEPEZIL (ARICEPT) 10 MG TABLET    Take 10 mg by mouth at bedtime.   INSULIN ASPART (NOVOLOG) 100 UNIT/ML INJECTION    Inject 5 Units into the skin 3 (three) times daily before meals. Only takes 5 units if cbg >150   INSULIN GLARGINE (LANTUS) 100 UNIT/ML INJECTION    Inject 0.2 mLs (20 Units total) into the skin at bedtime.   LISINOPRIL (PRINIVIL,ZESTRIL) 10 MG TABLET    Take 1 tablet (10 mg total) by mouth daily.   MEMANTINE HCL ER 14 MG CP24    Take 1 tablet (14 mg total) by mouth daily.   POLYETHYL GLYCOL-PROPYL  GLYCOL (SYSTANE) 0.4-0.3 % SOLN    Apply 1 drop to eye 3 (three) times daily.   POLYETHYLENE GLYCOL POWDER (GLYCOLAX/MIRALAX) POWDER    Take 17 g by mouth daily.   RANITIDINE (ZANTAC) 75 MG TABLET    Take 150 mg by mouth at bedtime.   SERTRALINE (ZOLOFT) 50 MG TABLET    Take 50 mg by mouth daily.  Modified Medications   No medications on file  Discontinued Medications   No medications on file     SIGNIFICANT DIAGNOSTIC EXAMS  07-13-14: dexa: t score -2.5  08-05-14: mammogram: No mammographic evidence of malignancy. A result letter of this screening mammogram will be mailed directly to the patient.     LABS REVIEWED:   09-19-13: urine micro-albumin 2.5 01-02-14: hgb a1c 7.8  02-22-14: wbc 5.4; hgb 11.1; hct 35.2 ;mcv 88.5; plt 161; glucose 198; bun 31; creat 1.98; k+4.4; na++138  05-14-14: chol 127; ldl 65; trig 76; hdl 46; hgb a1c 8.2  07-29-14: glucose 88; bun 14.9; creat 0.86; k+4.5; na++141; liver normal albumin 3.6  09-05-14: hgb a1c 8.2  Review of Systems Unable to perform ROS: Dementia    Physical Exam Constitutional: No distress.  Eyes: Conjunctivae are normal.  Neck: Neck supple. No JVD present. No thyromegaly present.  Cardiovascular: Normal rate, regular rhythm and intact distal pulses.   Respiratory: Effort normal and breath sounds normal. No respiratory distress. She has no wheezes.  GI: Soft. Bowel sounds are normal. She exhibits no distension. There is no tenderness.  Musculoskeletal: She exhibits no edema.  Able to move all extremities   Lymphadenopathy:    She has no cervical adenopathy.  Neurological: She is alert.  Skin: Skin is warm and dry. She is not diaphoretic.  Psychiatric: She has a normal mood and affect.     ASSESSMENT/ PLAN:  1.  Dyslipidemia: her ldl is 65; will continue lipitor 40 mg daily and will monitor her status   2. Diabetes: her hgb a1c is 8.2; will continue lantus 20 units daily and will continue novolog 5 units prior to meals  for cbg >=150 will monitor   3. Hypertension: is stable will continue lisinopril 10 mg daily; her urine micro-albumin is 2.5. Will not make changes will monitor her status.   4. CVA: she is neurologically stable will continue her asa 325 mg daily and will monitor   5. Gerd: will continue zantac 150 mg daily   6. Vascular dementia: no significant change in her status; will  namenda xr 14 mg daily will monitor; her current weight is 180 pounds and is stable  7. Depression: she is benefiting from zoloft 50 mg daily will make changes will monitor   8. Osteoporosis: will continue fosamax 70 mg weekly  vit d 2000 units daily will begin calcium 500 mg twice daily    Will check cbc; cmp and urine for micro-albumin    Synthia Innocent NP [facility]  Contact [phone] Monday through Friday 8am- 5pm  After hours call [phone]

## 2014-12-03 NOTE — Progress Notes (Signed)
Patient ID: [name] [name], female   DOB: October 28, 1940, 74 y.o.   MRN: [id number]    Facility: [facility]      No Known Allergies  Chief Complaint  Patient presents with  . Medical Management of Chronic Issues    HPI:  She is a long term resident of this facility being seen for the management of her chronic illnesses. She remains stable. She is unable to fully participate in the hpi or ros; states that she doing "ok". There are no nursing concerns at this time.    Past Medical History  Diagnosis Date  . Diabetes mellitus   . Herpes simplex   . Hyperlipidemia   . Urinary tract infection   . Altered mental status   . Esophageal reflux   . Congenital coronary artery anomaly   . Cerebrovascular disease   . Depression   . Constipation   . Dementia   . Generalized abdominal pain     No past surgical history on file.  VITAL SIGNS BP 119/60 mmHg  Pulse 74  Ht  (1.626 m)  Wt 179 lb (81.194 kg)  BMI 30.71 kg/m2  SpO2 99%  Patient's Medications  New Prescriptions   No medications on file  Previous Medications   ALENDRONATE (FOSAMAX) 70 MG TABLET    Take 70 mg by mouth once a week. Take with a full glass of water on an empty stomach.   ASPIRIN 325 MG EC TABLET    Take 325 mg by mouth daily.   ATORVASTATIN (LIPITOR) 40 MG TABLET    Take 40 mg by mouth daily.   CHOLECALCIFEROL (VITAMIN D) 1000 UNITS TABLET    Take 2,000 Units by mouth daily.    DONEPEZIL (ARICEPT) 10 MG TABLET    Take 10 mg by mouth at bedtime.   INSULIN ASPART (NOVOLOG) 100 UNIT/ML INJECTION    Inject 5 Units into the skin 3 (three) times daily before meals. Only takes 5 units if cbg >150   INSULIN GLARGINE (LANTUS) 100 UNIT/ML INJECTION    Inject 0.2 mLs (20 Units total) into the skin at bedtime.   LISINOPRIL (PRINIVIL,ZESTRIL) 10 MG TABLET    Take 1 tablet (10 mg total) by mouth daily.   MEMANTINE HCL ER 14 MG CP24    Take 1 tablet (14 mg total) by mouth daily.   POLYETHYL  GLYCOL-PROPYL GLYCOL (SYSTANE) 0.4-0.3 % SOLN    Apply 1 drop to eye 3 (three) times daily.   POLYETHYLENE GLYCOL POWDER (GLYCOLAX/MIRALAX) POWDER    Take 17 g by mouth daily.   RANITIDINE (ZANTAC) 75 MG TABLET    Take 150 mg by mouth at bedtime.   SERTRALINE (ZOLOFT) 50 MG TABLET    Take 50 mg by mouth daily.  Modified Medications   No medications on file  Discontinued Medications   No medications on file     SIGNIFICANT DIAGNOSTIC EXAMS   07-13-14: dexa: t score -2.5  08-05-14: mammogram: No mammographic evidence of malignancy. A result letter of this screening mammogram will be mailed directly to the patient.  10-29-14: chest x-ray: 1. No acute findings. 2. Atherosclerotic aortic arch.    LABS REVIEWED:   01-02-14: hgb a1c 7.8  02-22-14: wbc 5.4; hgb 11.1; hct 35.2 ;mcv 88.5; plt 161; glucose 198; bun 31; creat 1.98; k+4.4; na++138  05-14-14: chol 127; ldl 65; trig 76; hdl 46; hgb a1c 8.2  07-29-14: glucose 88; bun 14.9; creat 0.86; k+4.5; na++141; liver normal albumin 3.6  09-05-14: hgb a1c 8.2  10-07-14: wbc 4.4; hgb 9.9; hct 33.4; mcv 90.2 plt 149; glucose 67; bun 24; creat 0.94; k+ 4.5; na++142; liver normal albumin 3.3 10-09-14: urine for micro-albumin 1.2 (2015: 2.5 ) 10-29-14: hgb 12.9; hct 38.0  glucose 164; bun 26; creat 1.20; k+4.9; na++143;    .     Review of Systems Unable to perform ROS: Dementia    Physical Exam Constitutional: No distress.  Eyes: Conjunctivae are normal.  Neck: Neck supple. No JVD present. No thyromegaly present.  Cardiovascular: Normal rate, regular rhythm and intact distal pulses.   Respiratory: Effort normal and breath sounds normal. No respiratory distress. She has no wheezes.  GI: Soft. Bowel sounds are normal. She exhibits no distension. There is no tenderness.  Musculoskeletal: She exhibits no edema.  Able to move all extremities   Lymphadenopathy:    She has no cervical adenopathy.  Neurological: She is alert.  Skin: Skin is warm  and dry. She is not diaphoretic.  Psychiatric: She has a normal mood and affect.     ASSESSMENT/ PLAN:  1.  Dyslipidemia: her ldl is 65; will continue lipitor 40 mg daily and will monitor her status   2. Diabetes: her hgb a1c is 8.2; will continue lantus 20 units daily and will continue novolog 5 units prior to meals for cbg >=150 will monitor   3. Hypertension: is stable will continue lisinopril 10 mg daily; her urine micro-albumin is 1.2. Will not make changes will monitor her status.   4. CVA: she is neurologically stable will continue her asa 325 mg daily and will monitor   5. Gerd: will continue zantac 150 mg daily   6. Vascular dementia: no significant change in her status; will  namenda xr 14 mg daily will monitor; her current weight is 180 pounds and is stable  7. Depression: she is benefiting from zoloft 50 mg daily will make changes will monitor   8. Osteoporosis: will continue fosamax 70 mg weekly  vit d 2000 units daily will continue calcium 500 mg twice daily   9. Anemia: her current hgb is 9.9; hgb from 02-22-14 hgb was 11.1. Will repeat cbc and will monitor       Synthia Innocent NP [facility]  Contact [phone] Monday through Friday 8am- 5pm  After hours call [phone]

## 2014-12-14 ENCOUNTER — Non-Acute Institutional Stay (SKILLED_NURSING_FACILITY): Payer: Medicare Other | Admitting: Internal Medicine

## 2014-12-14 DIAGNOSIS — I6930 Unspecified sequelae of cerebral infarction: Secondary | ICD-10-CM | POA: Diagnosis not present

## 2014-12-14 DIAGNOSIS — K5901 Slow transit constipation: Secondary | ICD-10-CM

## 2014-12-14 DIAGNOSIS — M810 Age-related osteoporosis without current pathological fracture: Secondary | ICD-10-CM | POA: Diagnosis not present

## 2014-12-14 DIAGNOSIS — E785 Hyperlipidemia, unspecified: Secondary | ICD-10-CM | POA: Diagnosis not present

## 2014-12-14 DIAGNOSIS — E1140 Type 2 diabetes mellitus with diabetic neuropathy, unspecified: Secondary | ICD-10-CM

## 2014-12-14 DIAGNOSIS — F329 Major depressive disorder, single episode, unspecified: Secondary | ICD-10-CM

## 2014-12-14 DIAGNOSIS — K219 Gastro-esophageal reflux disease without esophagitis: Secondary | ICD-10-CM | POA: Diagnosis not present

## 2014-12-14 DIAGNOSIS — IMO0002 Reserved for concepts with insufficient information to code with codable children: Secondary | ICD-10-CM

## 2014-12-14 DIAGNOSIS — F0150 Vascular dementia without behavioral disturbance: Secondary | ICD-10-CM | POA: Diagnosis not present

## 2014-12-14 DIAGNOSIS — I10 Essential (primary) hypertension: Secondary | ICD-10-CM

## 2014-12-14 DIAGNOSIS — E1165 Type 2 diabetes mellitus with hyperglycemia: Secondary | ICD-10-CM | POA: Diagnosis not present

## 2014-12-14 DIAGNOSIS — F32A Depression, unspecified: Secondary | ICD-10-CM

## 2014-12-16 DIAGNOSIS — E119 Type 2 diabetes mellitus without complications: Secondary | ICD-10-CM | POA: Diagnosis not present

## 2014-12-16 DIAGNOSIS — E118 Type 2 diabetes mellitus with unspecified complications: Secondary | ICD-10-CM | POA: Diagnosis not present

## 2014-12-16 LAB — HEMOGLOBIN A1C: HEMOGLOBIN A1C: 7.5

## 2014-12-17 ENCOUNTER — Encounter: Payer: Self-pay | Admitting: Internal Medicine

## 2014-12-17 DIAGNOSIS — E1140 Type 2 diabetes mellitus with diabetic neuropathy, unspecified: Secondary | ICD-10-CM | POA: Insufficient documentation

## 2014-12-17 DIAGNOSIS — E1165 Type 2 diabetes mellitus with hyperglycemia: Secondary | ICD-10-CM

## 2014-12-17 DIAGNOSIS — IMO0002 Reserved for concepts with insufficient information to code with codable children: Secondary | ICD-10-CM | POA: Insufficient documentation

## 2014-12-17 NOTE — Progress Notes (Signed)
Patient ID: [name] [name], female   DOB: Jul 13, 1940, 74 y.o.   MRN: [id number]    DATE: 12/14/14  Location:  [facility]    Place of Service: SNF (31)   Extended Emergency Contact Information Primary Emergency Contact: Wynn,Blondine Address: [address]          [place] Home Phone: [phone] Work Phone: [phone] Relation: Sister  Advanced Directive information  FULL CODE  Chief Complaint  Patient presents with  . Medical Management of Chronic Issues    HPI:  74 yo female long term resident seen today for f/u. She is a poor historian due to dementia. Hx obtained from chart. No nursing issues. She has no c/o.   Osteoporosis - she takes fosamax weekly. No new abdominal pain. Due to repeat DXA in 2018  Dementia/depression - stable on namenda xr. No recent behavioral issues  DM - CBG 98 today. No low BS reactions. She takes insulin (lantus and humalog)  Hyperlipidemia - stable with diet  Hx CVA - stable. She is off lipitor and ACEI  GERD/constipation - stable on miralax and tums  Past Medical History  Diagnosis Date  . Diabetes mellitus   . Herpes simplex   . Hyperlipidemia   . Urinary tract infection   . Altered mental status   . Esophageal reflux   . Congenital coronary artery anomaly   . Cerebrovascular disease   . Depression   . Constipation   . Dementia   . Generalized abdominal pain     No past surgical history on file.  Patient Care Team: Kirt Boys, DO as PCP - General (Internal Medicine)  Social History   Social History  . Marital Status: Widowed    Spouse Name: N/A  . Number of Children: N/A  . Years of Education: N/A   Occupational History  . Not on file.   Social History Main Topics  . Smoking status: Former Games developer  . Smokeless tobacco: Not on file  . Alcohol Use: No  . Drug Use: No  . Sexual Activity: Not on file   Other Topics Concern  . Not on file   Social  History Narrative     reports that she has quit smoking. She does not have any smokeless tobacco history on file. She reports that she does not drink alcohol or use illicit drugs.  Immunization History  Administered Date(s) Administered  . Influenza Whole 12/23/2012  . Pneumococcal Polysaccharide-23 02/18/2009    No Known Allergies  Medications: Patient's Medications  New Prescriptions   No medications on file  Previous Medications   ALENDRONATE (FOSAMAX) 70 MG TABLET    Take 70 mg by mouth once a week. Take with a full glass of water on an empty stomach.   ASPIRIN 325 MG EC TABLET    Take 325 mg by mouth daily.   ATORVASTATIN (LIPITOR) 40 MG TABLET    Take 40 mg by mouth daily.   CHOLECALCIFEROL (VITAMIN D) 1000 UNITS TABLET    Take 2,000 Units by mouth daily.    DONEPEZIL (ARICEPT) 10 MG TABLET    Take 10 mg by mouth at bedtime.   INSULIN ASPART (NOVOLOG) 100 UNIT/ML INJECTION    Inject 5 Units into the skin 3 (three) times daily before meals. Only takes 5 units if cbg >150   INSULIN GLARGINE (LANTUS) 100 UNIT/ML INJECTION    Inject 0.2 mLs (20 Units total) into the skin at bedtime.  LISINOPRIL (PRINIVIL,ZESTRIL) 10 MG TABLET    Take 1 tablet (10 mg total) by mouth daily.   MEMANTINE HCL ER 14 MG CP24    Take 1 tablet (14 mg total) by mouth daily.   POLYETHYL GLYCOL-PROPYL GLYCOL (SYSTANE) 0.4-0.3 % SOLN    Apply 1 drop to eye 3 (three) times daily.   POLYETHYLENE GLYCOL POWDER (GLYCOLAX/MIRALAX) POWDER    Take 17 g by mouth daily.   RANITIDINE (ZANTAC) 75 MG TABLET    Take 150 mg by mouth at bedtime.   SERTRALINE (ZOLOFT) 50 MG TABLET    Take 50 mg by mouth daily.  Modified Medications   No medications on file  Discontinued Medications   No medications on file    Review of Systems  Unable to perform ROS: Dementia    Filed Vitals:   12/14/14 1242  BP: 126/68  Pulse: 78  Temp: 97.1 F (36.2 C)  Weight: 178 lb (80.74 kg)  SpO2: 97%   Body mass index is 30.54  kg/(m^2).  Physical Exam  Constitutional: She appears well-developed.  Sitting in w/c in NAD  HENT:  Mouth/Throat: Oropharynx is clear and moist. No oropharyngeal exudate.  Eyes: Pupils are equal, round, and reactive to light. No scleral icterus.  Neck: Neck supple. Carotid bruit is not present. No tracheal deviation present.  Cardiovascular: Normal rate, regular rhythm and intact distal pulses.  Exam reveals no gallop and no friction rub.   Murmur (1/6 SEM) heard. Trace R>LLE edema. No calf TTP  Pulmonary/Chest: Effort normal and breath sounds normal. No stridor. No respiratory distress. She has no wheezes. She has no rales.  Abdominal: Soft. Bowel sounds are normal. She exhibits no distension and no mass. There is no hepatomegaly. There is no tenderness. There is no rebound and no guarding.  Musculoskeletal: She exhibits edema.  Lymphadenopathy:    She has no cervical adenopathy.  Neurological: She is alert.  Skin: Skin is warm and dry. No rash noted.  Psychiatric: She has a normal mood and affect. Her behavior is normal.     Labs reviewed: Admission on 10/29/2014, Discharged on 10/29/2014  Component Date Value Ref Range Status  . Glucose-Capillary 10/29/2014 134* 65 - 99 mg/dL Final  . Troponin i, poc 10/29/2014 0.00  0.00 - 0.08 ng/mL Final  . Comment 3 10/29/2014          Final   Comment: Due to the release kinetics of cTnI, a negative result within the first hours of the onset of symptoms does not rule out myocardial infarction with certainty. If myocardial infarction is still suspected, repeat the test at appropriate intervals.   . Sodium 10/29/2014 143  135 - 145 mmol/L Final  . Potassium 10/29/2014 4.9  3.5 - 5.1 mmol/L Final  . Chloride 10/29/2014 108  101 - 111 mmol/L Final  . BUN 10/29/2014 26* 6 - 20 mg/dL Final  . Creatinine, Ser 10/29/2014 1.20* 0.44 - 1.00 mg/dL Final  . Glucose, Bld 16/12/9602 164* 65 - 99 mg/dL Final  . Calcium, Ion 54/11/8117 1.14  1.13 -  1.30 mmol/L Final  . TCO2 10/29/2014 23  0 - 100 mmol/L Final  . Hemoglobin 10/29/2014 12.9  12.0 - 15.0 g/dL Final  . HCT 14/78/2956 38.0  36.0 - 46.0 % Final    No results found.   Assessment/Plan   ICD-9-CM ICD-10-CM   1. Vascular dementia without behavioral disturbance - stable 290.40 F01.50   2. DM type 2, uncontrolled, with neuropathy (HCC) - BS  improving 250.62 E11.40    357.2 E11.65   3. Essential hypertension, benign - controlled off med 401.1 I10   4. Dyslipidemia - stable 272.4 E78.5   5. History of stroke with residual deficit - stable 438.9 I69.30   6. Depression - stable 311 F32.9   7. Gastroesophageal reflux disease, esophagitis presence not specified - stable 530.81 K21.9   8. Constipation due to slow transit - stable 564.01 K59.01   9.      Osteoporosis - stable  --check A1c  --cont other meds as ordered  --CBG qAC  --PT/OT/ST as indicated  --repeat DXA in 2018  --will follow  Kellye Mizner S. Ancil Linsey  [facility] [address] [place], [place] [address] [phone] Cell (Monday-Friday 8 AM - 5 PM) [phone] After 5 PM and follow prompts

## 2015-01-06 DIAGNOSIS — E119 Type 2 diabetes mellitus without complications: Secondary | ICD-10-CM | POA: Diagnosis not present

## 2015-01-06 DIAGNOSIS — H2513 Age-related nuclear cataract, bilateral: Secondary | ICD-10-CM | POA: Diagnosis not present

## 2015-01-06 DIAGNOSIS — Z794 Long term (current) use of insulin: Secondary | ICD-10-CM | POA: Diagnosis not present

## 2015-01-06 DIAGNOSIS — H40033 Anatomical narrow angle, bilateral: Secondary | ICD-10-CM | POA: Diagnosis not present

## 2015-01-06 DIAGNOSIS — H04123 Dry eye syndrome of bilateral lacrimal glands: Secondary | ICD-10-CM | POA: Diagnosis not present

## 2015-01-19 ENCOUNTER — Encounter: Payer: Self-pay | Admitting: Adult Health

## 2015-01-19 ENCOUNTER — Non-Acute Institutional Stay (SKILLED_NURSING_FACILITY): Payer: Medicare Other | Admitting: Adult Health

## 2015-01-19 DIAGNOSIS — E785 Hyperlipidemia, unspecified: Secondary | ICD-10-CM | POA: Diagnosis not present

## 2015-01-19 DIAGNOSIS — M810 Age-related osteoporosis without current pathological fracture: Secondary | ICD-10-CM | POA: Diagnosis not present

## 2015-01-19 DIAGNOSIS — I6930 Unspecified sequelae of cerebral infarction: Secondary | ICD-10-CM | POA: Diagnosis not present

## 2015-01-19 DIAGNOSIS — E1140 Type 2 diabetes mellitus with diabetic neuropathy, unspecified: Secondary | ICD-10-CM | POA: Diagnosis not present

## 2015-01-19 DIAGNOSIS — Z794 Long term (current) use of insulin: Secondary | ICD-10-CM | POA: Diagnosis not present

## 2015-01-19 DIAGNOSIS — F0150 Vascular dementia without behavioral disturbance: Secondary | ICD-10-CM

## 2015-01-19 DIAGNOSIS — K5901 Slow transit constipation: Secondary | ICD-10-CM

## 2015-01-19 DIAGNOSIS — D638 Anemia in other chronic diseases classified elsewhere: Secondary | ICD-10-CM | POA: Diagnosis not present

## 2015-01-19 DIAGNOSIS — I10 Essential (primary) hypertension: Secondary | ICD-10-CM | POA: Diagnosis not present

## 2015-01-19 DIAGNOSIS — K219 Gastro-esophageal reflux disease without esophagitis: Secondary | ICD-10-CM

## 2015-01-19 NOTE — Progress Notes (Signed)
Patient ID: [name] [name], female   DOB: 12/15/1940, 74 y.o.   MRN: [id number]   [facility]ility[facility]      No Known Allergies  Chief Complaint  Patient presents with  . Medical Management of Chronic Issues    HPI:  She is a long term resident of this facility being seen for the management of her chronic illnesses. Overall her status is without significant change. Her weight is 175 pounds in Aug her weight was 179 pounds. She is unable to fully participate in the hpi or ros; but states that she is feeling good. There are no nursing concerns at this time.     Past Medical History  Diagnosis Date  . Diabetes mellitus   . Herpes simplex   . Hyperlipidemia   . Urinary tract infection   . Altered mental status   . Esophageal reflux   . Congenital coronary artery anomaly   . Cerebrovascular disease   . Depression   . Constipation   . Dementia   . Generalized abdominal pain     No past surgical history on file.  VITAL SIGNS BP 131/68 mmHg  Pulse 63  Ht 5\' 4"  (1.626 m)  Wt 175 lb (79.379 kg)  BMI 30.02 kg/m2  SpO2 97%  Patient's Medications  New Prescriptions   No medications on file  Previous Medications   ALENDRONATE (FOSAMAX) 70 MG TABLET    Take 70 mg by mouth once a week. Take with a full glass of water on an empty stomach.   ASPIRIN 325 MG EC TABLET    Take 325 mg by mouth daily.   ATORVASTATIN (LIPITOR) 40 MG TABLET    Take 40 mg by mouth daily.   CALCIUM CARBONATE (TUMS - DOSED IN MG ELEMENTAL CALCIUM) 500 MG CHEWABLE TABLET    Chew 1 tablet by mouth 2 (two) times daily.   CHOLECALCIFEROL (VITAMIN D) 1000 UNITS TABLET    Take 1,000 Units by mouth daily.    DONEPEZIL (ARICEPT) 10 MG TABLET    Take 5 mg by mouth at bedtime.    INSULIN ASPART (NOVOLOG) 100 UNIT/ML INJECTION    Inject 5 Units into the skin 3 (three) times daily before meals. Only takes 5 units if cbg >150   INSULIN GLARGINE (LANTUS) 100 UNIT/ML INJECTION    Inject 0.2 mLs (20 Units  total) into the skin at bedtime.   LISINOPRIL (PRINIVIL,ZESTRIL) 10 MG TABLET    Take 1 tablet (10 mg total) by mouth daily.   MEMANTINE HCL ER 14 MG CP24    Take 1 tablet (14 mg total) by mouth daily.   POLYETHYL GLYCOL-PROPYL GLYCOL (SYSTANE) 0.4-0.3 % SOLN    Apply 1 drop to eye 3 (three) times daily.   POLYETHYLENE GLYCOL POWDER (GLYCOLAX/MIRALAX) POWDER    Take 17 g by mouth daily.   RANITIDINE (ZANTAC) 75 MG TABLET    Take 150 mg by mouth at bedtime.   SERTRALINE (ZOLOFT) 50 MG TABLET    Take 50 mg by mouth daily.  Modified Medications   No medications on file  Discontinued Medications   No medications on file     SIGNIFICANT DIAGNOSTIC EXAMS   07-13-14: dexa: t score -2.5  08-05-14: mammogram: No mammographic evidence of malignancy. A result letter of this screening mammogram will be mailed directly to the patient.  10-29-14: chest x-ray: 1. No acute findings. 2. Atherosclerotic aortic arch.    LABS REVIEWED:   02-22-14: wbc 5.4; hgb 11.1; hct  35.2 ;mcv 88.5; plt 161; glucose 198; bun 31; creat 1.98; k+4.4; na++138  05-14-14: chol 127; ldl 65; trig 76; hdl 46; hgb a1c 8.2  07-29-14: glucose 88; bun 14.9; creat 0.86; k+4.5; na++141; liver normal albumin 3.6  09-05-14: hgb a1c 8.2  10-07-14: wbc 4.4; hgb 9.9; hct 33.4; mcv 90.2 plt 149; glucose 67; bun 24; creat 0.94; k+ 4.5; na++142; liver normal albumin 3.3 10-09-14: urine for micro-albumin 1.2 (2015: 2.5 ) 10-29-14: hgb 12.9; hct 38.0  glucose 164; bun 26; creat 1.20; k+4.9; na++143;  12-16-14: hgb a1c 7.5    .   Review of Systems Unable to perform ROS: Dementia    Physical Exam Constitutional: No distress.  Eyes: Conjunctivae are normal.  Neck: Neck supple. No JVD present. No thyromegaly present.  Cardiovascular: Normal rate, regular rhythm and intact distal pulses.   Respiratory: Effort normal and breath sounds normal. No respiratory distress. She has no wheezes.  GI: Soft. Bowel sounds are normal. She exhibits no  distension. There is no tenderness.  Musculoskeletal: She exhibits no edema.  Able to move all extremities   Lymphadenopathy:    She has no cervical adenopathy.  Neurological: She is alert.  Skin: Skin is warm and dry. She is not diaphoretic.  Psychiatric: She has a normal mood and affect.       ASSESSMENT/ PLAN:  1.  Dyslipidemia: her ldl is 65; will continue lipitor 40 mg daily and will monitor her status   2. Diabetes: her hgb a1c is 7.5; will continue lantus 20 units daily and will continue novolog 5 units prior to meals for cbg >=150 will monitor   3. Hypertension: is stable will continue lisinopril 10 mg daily; her urine micro-albumin is 1.2. Will not make changes will monitor her status.   4. CVA: she is neurologically stable will continue her asa 325 mg daily and will monitor   5. Gerd: will continue zantac 150 mg daily   6. Vascular dementia: no significant change in her status; will  namenda xr 14 mg daily  And aricept 5 mg nightly will monitor; her current weight is 175 pounds and is stable  7. Depression: she is benefiting from zoloft 50 mg daily will make changes will monitor   8. Osteoporosis: will continue fosamax 70 mg weekly  vit d 1000 units daily will continue calcium 500 mg twice daily  t score is -2.5   9. Anemia: her current hgb is 9.9; hgb from 02-22-14 hgb was 11.1.  will monitor       Synthia Innocent NP [facility]  Contact [phone] Monday through Friday 8am- 5pm  After hours call [phone]

## 2015-02-18 ENCOUNTER — Non-Acute Institutional Stay (SKILLED_NURSING_FACILITY): Payer: Medicare Other | Admitting: Adult Health

## 2015-02-18 DIAGNOSIS — D638 Anemia in other chronic diseases classified elsewhere: Secondary | ICD-10-CM

## 2015-02-18 DIAGNOSIS — I10 Essential (primary) hypertension: Secondary | ICD-10-CM

## 2015-02-18 DIAGNOSIS — E1140 Type 2 diabetes mellitus with diabetic neuropathy, unspecified: Secondary | ICD-10-CM

## 2015-02-18 DIAGNOSIS — I6930 Unspecified sequelae of cerebral infarction: Secondary | ICD-10-CM | POA: Diagnosis not present

## 2015-02-18 DIAGNOSIS — E785 Hyperlipidemia, unspecified: Secondary | ICD-10-CM | POA: Diagnosis not present

## 2015-02-18 DIAGNOSIS — Z794 Long term (current) use of insulin: Secondary | ICD-10-CM

## 2015-02-18 DIAGNOSIS — M810 Age-related osteoporosis without current pathological fracture: Secondary | ICD-10-CM

## 2015-02-18 DIAGNOSIS — K5901 Slow transit constipation: Secondary | ICD-10-CM

## 2015-02-18 DIAGNOSIS — K219 Gastro-esophageal reflux disease without esophagitis: Secondary | ICD-10-CM | POA: Diagnosis not present

## 2015-02-18 DIAGNOSIS — F0150 Vascular dementia without behavioral disturbance: Secondary | ICD-10-CM

## 2015-02-24 ENCOUNTER — Non-Acute Institutional Stay (SKILLED_NURSING_FACILITY): Payer: Medicare Other | Admitting: Adult Health

## 2015-02-24 DIAGNOSIS — E1140 Type 2 diabetes mellitus with diabetic neuropathy, unspecified: Secondary | ICD-10-CM | POA: Diagnosis not present

## 2015-02-24 DIAGNOSIS — Z794 Long term (current) use of insulin: Secondary | ICD-10-CM | POA: Diagnosis not present

## 2015-02-24 DIAGNOSIS — E785 Hyperlipidemia, unspecified: Secondary | ICD-10-CM | POA: Diagnosis not present

## 2015-02-24 DIAGNOSIS — I10 Essential (primary) hypertension: Secondary | ICD-10-CM

## 2015-02-24 DIAGNOSIS — M810 Age-related osteoporosis without current pathological fracture: Secondary | ICD-10-CM | POA: Diagnosis not present

## 2015-02-24 DIAGNOSIS — F0150 Vascular dementia without behavioral disturbance: Secondary | ICD-10-CM | POA: Diagnosis not present

## 2015-02-25 DIAGNOSIS — I10 Essential (primary) hypertension: Secondary | ICD-10-CM | POA: Diagnosis not present

## 2015-02-25 DIAGNOSIS — D638 Anemia in other chronic diseases classified elsewhere: Secondary | ICD-10-CM | POA: Diagnosis not present

## 2015-02-25 DIAGNOSIS — F0390 Unspecified dementia without behavioral disturbance: Secondary | ICD-10-CM | POA: Diagnosis not present

## 2015-02-25 DIAGNOSIS — E118 Type 2 diabetes mellitus with unspecified complications: Secondary | ICD-10-CM | POA: Diagnosis not present

## 2015-02-25 LAB — BASIC METABOLIC PANEL
BUN: 33 mg/dL — AB (ref 4–21)
CREATININE: 1 mg/dL (ref <=1.1)
Glucose: 73 mg/dL
POTASSIUM: 4.5 mmol/L (ref 3.4–5.3)
SODIUM: 143 mmol/L (ref 137–147)

## 2015-02-25 LAB — HEPATIC FUNCTION PANEL
ALT: 7 U/L (ref 7–35)
AST: 11 U/L — AB (ref 13–35)
Alkaline Phosphatase: 68 U/L (ref 25–125)
BILIRUBIN, TOTAL: 0.2 mg/dL

## 2015-02-25 LAB — TSH: TSH: 0.47 u[IU]/mL (ref <=5.90)

## 2015-02-25 LAB — CBC AND DIFFERENTIAL
HCT: 32 % — AB (ref 36–46)
Hemoglobin: 9.8 g/dL — AB (ref 12.0–16.0)
PLATELETS: 164 10*3/uL (ref 150–399)
WBC: 5.1 10^3/mL

## 2015-03-14 ENCOUNTER — Encounter: Payer: Self-pay | Admitting: Adult Health

## 2015-03-14 MED ORDER — MEMANTINE HCL ER 21 MG PO CP24: 21.0000 mg | ORAL_CAPSULE | Freq: Every day | ORAL | Status: AC

## 2015-03-14 MED ORDER — SERTRALINE HCL 50 MG PO TABS: 75.0000 mg | ORAL_TABLET | Freq: Every day | ORAL | Status: DC

## 2015-03-14 NOTE — Progress Notes (Signed)
Patient ID: [name] [name], female   DOB: 11/25/1940, 75 y.o.   MRN: [id number]    Facility:  [facility]      No Known Allergies  Chief Complaint  Patient presents with  . Medical Management of Chronic Issues    HPI:  She is a long term resident of this facility being seen for the management of her chronic illnesses. Her weight in August 2016 was 179 pounds her current weight is 173 pounds. She cannot fully participate in the hpi or ros; but told me that she is feeling good. There are no nursing concerns today.      Past Medical History  Diagnosis Date  . Diabetes mellitus   . Herpes simplex   . Hyperlipidemia   . Urinary tract infection   . Altered mental status   . Esophageal reflux   . Congenital coronary artery anomaly   . Cerebrovascular disease   . Depression   . Constipation   . Dementia   . Generalized abdominal pain     History reviewed. No pertinent past surgical history.  VITAL SIGNS BP 120/64 mmHg  Pulse 68  Ht 5\' 4"  (1.626 m)  Wt 173 lb (78.472 kg)  BMI 29.68 kg/m2  SpO2 97%  Patient's Medications  New Prescriptions   No medications on file  Previous Medications   ALENDRONATE (FOSAMAX) 70 MG TABLET    Take 70 mg by mouth once a week. Take with a full glass of water on an empty stomach.   ASPIRIN 325 MG EC TABLET    Take 325 mg by mouth daily.   ATORVASTATIN (LIPITOR) 40 MG TABLET    Take 40 mg by mouth daily.   CALCIUM CARBONATE (TUMS - DOSED IN MG ELEMENTAL CALCIUM) 500 MG CHEWABLE TABLET    Chew 1 tablet by mouth 2 (two) times daily.   CHOLECALCIFEROL (VITAMIN D) 1000 UNITS TABLET    Take 1,000 Units by mouth daily.    DONEPEZIL (ARICEPT) 10 MG TABLET    Take 5 mg by mouth at bedtime.    INSULIN ASPART (NOVOLOG) 100 UNIT/ML INJECTION    Inject 5 Units into the skin 3 (three) times daily before meals. Only takes 5 units if cbg >150   INSULIN GLARGINE (LANTUS) 100 UNIT/ML INJECTION    Inject 0.2 mLs (20 Units total) into the skin at bedtime.   LISINOPRIL (PRINIVIL,ZESTRIL) 10 MG TABLET    Take 1 tablet (10 mg total) by mouth daily.   MEMANTINE HCL ER 14 MG CP24    Take 1 tablet (14 mg total) by mouth daily.   POLYETHYL GLYCOL-PROPYL GLYCOL (SYSTANE) 0.4-0.3 % SOLN    Apply 1 drop to eye 3 (three) times daily.   POLYETHYLENE GLYCOL POWDER (GLYCOLAX/MIRALAX) POWDER    Take 17 g by mouth daily.   RANITIDINE (ZANTAC) 75 MG TABLET    Take 150 mg by mouth at bedtime.   SERTRALINE (ZOLOFT) 50 MG TABLET    Take 50 mg by mouth daily.  Modified Medications   No medications on file  Discontinued Medications   No medications on file     SIGNIFICANT DIAGNOSTIC EXAMS   07-13-14: dexa: t score -2.5  08-05-14: mammogram: No mammographic evidence of malignancy. A result letter of this screening mammogram will be mailed directly to the patient.  10-29-14: chest x-ray: 1. No acute findings. 2. Atherosclerotic aortic arch.    LABS REVIEWED:   02-22-14: wbc 5.4; hgb 11.1; hct 35.2 ;mcv 88.5; plt 161; glucose 198;  bun 31; creat 1.98; k+4.4; na++138  05-14-14: chol 127; ldl 65; trig 76; hdl 46; hgb a1c 8.2  07-29-14: glucose 88; bun 14.9; creat 0.86; k+4.5; na++141; liver normal albumin 3.6  09-05-14: hgb a1c 8.2  10-07-14: wbc 4.4; hgb 9.9; hct 33.4; mcv 90.2 plt 149; glucose 67; bun 24; creat 0.94; k+ 4.5; na++142; liver normal albumin 3.3 10-09-14: urine for micro-albumin 1.2 (2015: 2.5 ) 10-29-14: hgb 12.9; hct 38.0  glucose 164; bun 26; creat 1.20; k+4.9; na++143;  12-16-14: hgb a1c 7.5    .   Review of Systems Unable to perform ROS: Dementia    Physical Exam Constitutional: No distress.  Eyes: Conjunctivae are normal.  Neck: Neck supple. No JVD present. No thyromegaly present.  Cardiovascular: Normal rate, regular rhythm and intact distal pulses.   Respiratory: Effort normal and breath sounds normal. No respiratory distress. She has no wheezes.  GI: Soft. Bowel sounds are normal. She exhibits no distension. There is no tenderness.    Musculoskeletal: She exhibits no edema.  Able to move all extremities   Lymphadenopathy:    She has no cervical adenopathy.  Neurological: She is alert.  Skin: Skin is warm and dry. She is not diaphoretic.  Psychiatric: She has a normal mood and affect.       ASSESSMENT/ PLAN:  1.  Dyslipidemia: her ldl is 65; will continue lipitor 40 mg daily and will monitor her status   2. Diabetes: her hgb a1c is 7.5; will continue lantus 20 units daily and will continue novolog 5 units prior to meals for cbg >=150 will monitor   3. Hypertension: is stable will continue lisinopril 10 mg daily; her urine micro-albumin is 1.2. Will not make changes will monitor her status.   4. CVA: she is neurologically stable will continue her asa 325 mg daily and will monitor   5. Gerd: will continue zantac 150 mg daily   6. Vascular dementia: no significant change in her status; will  namenda xr 14 mg daily  And aricept 5 mg nightly will monitor; her current weight is 175 pounds and is stable  7. Depression: she is benefiting from zoloft 50 mg daily will make changes will monitor   8. Osteoporosis: will continue fosamax 70 mg weekly  vit d 1000 units daily will continue calcium 500 mg twice daily  t score is -2.5   9. Anemia: her current hgb is 9.9; hgb from 02-22-14 hgb was 11.1.  will monitor            Synthia Innocent NP [facility]  Contact [phone] Monday through Friday 8am- 5pm  After hours call [phone]

## 2015-03-14 NOTE — Progress Notes (Signed)
Patient ID: [name] [name], female   DOB: [birth year], 75 y.o.   MRN: [id number]    Facility:  [facility]      No Known Allergies  Chief Complaint  Patient presents with  . Acute Visit    patient status     HPI:  Staff reports that she is spending most of her time in bed. Her appetite has decreased. She tells me that she is tired and cold all there time. Her weight in June 2016 189 with her current weight 173 pounds. There are no reports of fevers present.    Past Medical History  Diagnosis Date  . Diabetes mellitus   . Herpes simplex   . Hyperlipidemia   . Urinary tract infection   . Altered mental status   . Esophageal reflux   . Congenital coronary artery anomaly   . Cerebrovascular disease   . Depression   . Constipation   . Dementia   . Generalized abdominal pain     No past surgical history on file.  VITAL SIGNS BP 131/72 mmHg  Pulse 80  Ht 5\' 4"  (1.626 m)  Wt 173 lb (78.472 kg)  BMI 29.68 kg/m2  Patient's Medications  New Prescriptions   No medications on file  Previous Medications   ALENDRONATE (FOSAMAX) 70 MG TABLET    Take 70 mg by mouth once a week. Take with a full glass of water on an empty stomach.   ASPIRIN 325 MG EC TABLET    Take 325 mg by mouth daily.   ATORVASTATIN (LIPITOR) 40 MG TABLET    Take 40 mg by mouth daily.   CALCIUM CARBONATE (TUMS - DOSED IN MG ELEMENTAL CALCIUM) 500 MG CHEWABLE TABLET    Chew 1 tablet by mouth 2 (two) times daily.   CHOLECALCIFEROL (VITAMIN D) 1000 UNITS TABLET    Take 1,000 Units by mouth daily.    DONEPEZIL (ARICEPT) 10 MG TABLET    Take 5 mg by mouth at bedtime.    INSULIN ASPART (NOVOLOG) 100 UNIT/ML INJECTION    Inject 5 Units into the skin 3 (three) times daily before meals. Only takes 5 units if cbg >150   INSULIN GLARGINE (LANTUS) 100 UNIT/ML INJECTION    Inject 0.2 mLs (20 Units total) into the skin at bedtime.   LISINOPRIL (PRINIVIL,ZESTRIL) 10 MG TABLET    Take 1 tablet (10 mg total) by mouth daily.     MEMANTINE HCL ER 14 MG CP24    Take 1 tablet (14 mg total) by mouth daily.   POLYETHYL GLYCOL-PROPYL GLYCOL (SYSTANE) 0.4-0.3 % SOLN    Apply 1 drop to eye 3 (three) times daily.   POLYETHYLENE GLYCOL POWDER (GLYCOLAX/MIRALAX) POWDER    Take 17 g by mouth daily.   RANITIDINE (ZANTAC) 75 MG TABLET    Take 150 mg by mouth at bedtime.   SERTRALINE (ZOLOFT) 50 MG TABLET    Take 50 mg by mouth daily.  Modified Medications   No medications on file  Discontinued Medications   No medications on file     SIGNIFICANT DIAGNOSTIC EXAMS   07-13-14: dexa: t score -2.5  08-05-14: mammogram: No mammographic evidence of malignancy. A result letter of this screening mammogram will be mailed directly to the patient.  10-29-14: chest x-ray: 1. No acute findings. 2. Atherosclerotic aortic arch.    LABS REVIEWED:   05-14-14: chol 127; ldl 65; trig 76; hdl 46; hgb a1c 8.2  07-29-14: glucose 88; bun 14.9; creat 0.86; k+4.5; na++141;  liver normal albumin 3.6  09-05-14: hgb a1c 8.2  10-07-14: wbc 4.4; hgb 9.9; hct 33.4; mcv 90.2 plt 149; glucose 67; bun 24; creat 0.94; k+ 4.5; na++142; liver normal albumin 3.3 10-09-14: urine for micro-albumin 1.2 (2015: 2.5 ) 10-29-14: hgb 12.9; hct 38.0  glucose 164; bun 26; creat 1.20; k+4.9; na++143;  12-16-14: hgb a1c 7.5    .   Review of Systems Unable to perform ROS: Dementia    Physical Exam Constitutional: No distress.  Eyes: Conjunctivae are normal.  Neck: Neck supple. No JVD present. No thyromegaly present.  Cardiovascular: Normal rate, regular rhythm and intact distal pulses.   Respiratory: Effort normal and breath sounds normal. No respiratory distress. She has no wheezes.  GI: Soft. Bowel sounds are normal. She exhibits no distension. There is no tenderness.  Musculoskeletal: She exhibits no edema.  Able to move all extremities   Lymphadenopathy:    She has no cervical adenopathy.  Neurological: She is alert.  Skin: Skin is warm and dry. She is not  diaphoretic.  Psychiatric: She has a normal mood and affect.       ASSESSMENT/ PLAN:  1.  Dyslipidemia: her ldl is 65; will stop her lipitor due to weight loss and loss of appetite    2. Diabetes: her hgb a1c is 7.5; will continue lantus 20 units daily and will continue novolog 5 units prior to meals for cbg >=150 will monitor   3. Hypertension: is stable will continue lisinopril 10 mg daily; her urine micro-albumin is 1.2. Will not make changes will monitor her status.   4. Vascular dementia: no significant change in her status; will continue  aricept 5 mg nightly will increase her namenda to 21 mg daily  will monitor; her current weight is 173 pounds and is stable  5. Depression: will increase her zoloft to 75 mg daily to help improve her mood state   6. Osteoporosis: will continue vit d 100 units daily and calcium 500 mg twice daily will stop fosamax due to her weight loss and appetite loss  t score is -2.5         Synthia Innocenteborah Green NP [facility]  Contact [phone] Monday through Friday 8am- 5pm  After hours call [phone]

## 2015-03-25 LAB — LIPID PANEL
CHOLESTEROL: 208 mg/dL — AB (ref 0–200)
HDL: 55 mg/dL (ref 35–70)
LDL Cholesterol: 125 mg/dL
TRIGLYCERIDES: 144 mg/dL (ref 40–160)

## 2015-03-29 ENCOUNTER — Non-Acute Institutional Stay (SKILLED_NURSING_FACILITY): Payer: Medicare Other | Admitting: Internal Medicine

## 2015-03-29 DIAGNOSIS — M810 Age-related osteoporosis without current pathological fracture: Secondary | ICD-10-CM | POA: Diagnosis not present

## 2015-03-29 DIAGNOSIS — F329 Major depressive disorder, single episode, unspecified: Secondary | ICD-10-CM

## 2015-03-29 DIAGNOSIS — F0150 Vascular dementia without behavioral disturbance: Secondary | ICD-10-CM

## 2015-03-29 DIAGNOSIS — F32A Depression, unspecified: Secondary | ICD-10-CM

## 2015-03-29 NOTE — Progress Notes (Signed)
MRN: [id number] Name: [name] [name]  Sex: female Age: 75 y.o. DOB: 03/20/1940  [facility]216A Level Of Care: SNF Provider: Merrilee SeashoreALEXANDER, Katherene Dinino D Emergency Contacts: Extended Emergency Contact Information Primary Emergency Contact: Wynn,Blondine Address: [address]          [place] Home Phone: [phone] Work Phone: [phone] Relation: Sister  Code Status:   Allergies: Review of patient's allergies indicates no known allergies.  Chief Complaint  Patient presents with  . Medical Management of Chronic Issues    HPI: Patient is 75 y.o. female with DM2.,HLD, GERD, dementia, who is being seen for routine issues of dementia, depression, and osteoporosis.  Past Medical History  Diagnosis Date  . Diabetes mellitus   . Herpes simplex   . Hyperlipidemia   . Urinary tract infection   . Altered mental status   . Esophageal reflux   . Congenital coronary artery anomaly   . Cerebrovascular disease   . Depression   . Constipation   . Dementia   . Generalized abdominal pain     No past surgical history on file.    Medication List       This list is accurate as of: 03/29/15 11:59 PM.  Always use your most recent med list.               aspirin 325 MG EC tablet  Take 325 mg by mouth daily.     calcium carbonate 500 MG chewable tablet  Commonly known as:  TUMS - dosed in mg elemental calcium  Chew 1 tablet by mouth 2 (two) times daily.     cholecalciferol 1000 units tablet  Commonly known as:  VITAMIN D  Take 1,000 Units by mouth daily.     donepezil 10 MG tablet  Commonly known as:  ARICEPT  Take 5 mg by mouth at bedtime.     insulin aspart 100 UNIT/ML injection  Commonly known as:  novoLOG  Inject 5 Units into the skin 3 (three) times daily before meals. Only takes 5 units if cbg >150     insulin glargine 100 UNIT/ML injection  Commonly known as:  LANTUS  Inject 0.2 mLs (20 Units total) into the skin  at bedtime.     lisinopril 10 MG tablet  Commonly known as:  PRINIVIL,ZESTRIL  Take 1 tablet (10 mg total) by mouth daily.     Memantine HCl ER 21 MG Cp24  Commonly known as:  NAMENDA XR  Take 21 mg by mouth daily.     polyethylene glycol powder powder  Commonly known as:  GLYCOLAX/MIRALAX  Take 17 g by mouth daily.     ranitidine 75 MG tablet  Commonly known as:  ZANTAC  Take 150 mg by mouth at bedtime.     sertraline 50 MG tablet  Commonly known as:  ZOLOFT  Take 1.5 tablets (75 mg total) by mouth daily.     SYSTANE 0.4-0.3 % Soln  Generic drug:  Polyethyl Glycol-Propyl Glycol  Apply 1 drop to eye 3 (three) times daily.        No orders of the defined types were placed in this encounter.    Immunization History  Administered Date(s) Administered  . Influenza Whole 12/23/2012  . Pneumococcal Polysaccharide-23 02/18/2009    Social History  Substance Use Topics  . Smoking status: Former Games developermoker  . Smokeless tobacco: Not on file  . Alcohol Use: No    Review of Systems UTO 2/2 pt  dementia    Filed Vitals:   04/03/15 1545  BP: 121/70  Pulse: 70  Temp: 97.1 F (36.2 C)  Resp: 18    Physical Exam  GENERAL APPEARANCE: Alert,  No acute distress  SKIN: No diaphoresis rash, or wounds HEENT: Unremarkable RESPIRATORY: Breathing is even, unlabored. Lung sounds are clear   CARDIOVASCULAR: Heart RRR no murmurs, rubs or gallops. No peripheral edema  GASTROINTESTINAL: Abdomen is soft, non-tender, not distended w/ normal bowel sounds.  GENITOURINARY: Bladder non tender, not distended  MUSCULOSKELETAL: No abnormal joints or musculature NEUROLOGIC: Cranial nerves 2-12 grossly intact. Moves all extremities PSYCHIATRIC: Mood and affect appropriate to situation, no behavioral issues  Patient Active Problem List   Diagnosis Date Noted  . DM type 2, uncontrolled, with neuropathy (HCC) 12/17/2014  . Anemia in other chronic diseases classified elsewhere 12/03/2014  .  Osteoporosis 11/10/2014  . Dyslipidemia 06/17/2014  . History of stroke with residual deficit 04/04/2014  . Constipation due to slow transit 02/25/2014  . Anemia 04/22/2013  . Vascular dementia without behavioral disturbance 03/12/2013  . 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 Essential hypertension, benign 07/02/2012  . Unspecified vitamin D deficiency 07/02/2012  . Depression 07/02/2012  . GERD (gastroesophageal reflux disease) 07/02/2012    CBC    Component Value Date/Time   WBC 5.1 09/18/2013   WBC 5.0 08/27/2011 1803   RBC 3.84* 08/27/2011 1803   RBC 3.36* 01/19/2010 1309   HGB 12.9 10/29/2014 1327   HCT 38.0 10/29/2014 1327   PLT 275 09/18/2013   MCV 88.3 08/27/2011 1803   LYMPHSABS 1.8 08/27/2011 1803   MONOABS 0.4 08/27/2011 1803   EOSABS 0.1 08/27/2011 1803   BASOSABS 0.0 08/27/2011 1803    CMP     Component Value Date/Time   NA 143 10/29/2014 1327   NA 138 03/09/2014   K 4.9 10/29/2014 1327   CL 108 10/29/2014 1327   CO2 28 08/27/2011 1803   GLUCOSE 164* 10/29/2014 1327   BUN 26* 10/29/2014 1327   BUN 31* 03/09/2014   CREATININE 1.20* 10/29/2014 1327   CREATININE 2.0* 03/09/2014   CALCIUM 9.4 08/27/2011 1803   PROT 7.3 01/17/2010 1318   ALBUMIN 3.7 01/17/2010 1318   AST 23 01/17/2010 1318   ALT 16 01/17/2010 1318   ALKPHOS 69 01/17/2010 1318   BILITOT 0.5 01/17/2010 1318   GFRNONAA 85* 08/27/2011 1803   GFRAA >90 08/27/2011 1803    Assessment and Plan  Vascular dementia without behavioral disturbance : no significant change in her status; will continue aricept 5 mg nightly will increase her namenda to 21 mg daily will monitor; her current weight is 173 pounds and is stable  Depression  Zoloft to 75 mg daily to help improve her mood state ; will cont to moniotr  Osteoporosis continue vit d 100 units daily and calcium 500 mg twice daily; fosamax was d/c 2/2 to her weight loss and appetite loss;  t score is -2.5     Margit Hanks, MD

## 2015-04-03 ENCOUNTER — Encounter: Payer: Self-pay | Admitting: Internal Medicine

## 2015-04-03 NOTE — Assessment & Plan Note (Signed)
:   no significant change in her status; will continue aricept 5 mg nightly will increase her namenda to 21 mg daily will monitor; her current weight is 173 pounds and is stable

## 2015-04-03 NOTE — Assessment & Plan Note (Signed)
continue vit d 100 units daily and calcium 500 mg twice daily; fosamax was d/c 2/2 to her weight loss and appetite loss; t score is -2.5

## 2015-04-03 NOTE — Assessment & Plan Note (Signed)
Zoloft to 75 mg daily to help improve her mood state ; will cont to moniotr

## 2015-04-19 ENCOUNTER — Encounter: Payer: Self-pay | Admitting: Internal Medicine

## 2015-04-26 ENCOUNTER — Encounter: Payer: Self-pay | Admitting: Internal Medicine

## 2015-04-26 ENCOUNTER — Non-Acute Institutional Stay (SKILLED_NURSING_FACILITY): Payer: Medicare Other | Admitting: Internal Medicine

## 2015-04-26 DIAGNOSIS — E785 Hyperlipidemia, unspecified: Secondary | ICD-10-CM

## 2015-04-26 DIAGNOSIS — I10 Essential (primary) hypertension: Secondary | ICD-10-CM

## 2015-04-26 DIAGNOSIS — E1165 Type 2 diabetes mellitus with hyperglycemia: Secondary | ICD-10-CM | POA: Diagnosis not present

## 2015-04-26 DIAGNOSIS — IMO0002 Reserved for concepts with insufficient information to code with codable children: Secondary | ICD-10-CM

## 2015-04-26 DIAGNOSIS — E1140 Type 2 diabetes mellitus with diabetic neuropathy, unspecified: Secondary | ICD-10-CM | POA: Diagnosis not present

## 2015-04-26 NOTE — Progress Notes (Signed)
MRN: [id number] Name: [name] [name]  Sex: female Age: 75 y.o. DOB: 01/30/1941  [facility] Facility/Room:216 Level Of Care: SNF Provider: Merrilee Seashore D Emergency Contacts: Extended Emergency Contact Information Primary Emergency Contact: Wynn,Blondine Address: [address]          [place] Home Phone: [phone] Work Phone: [phone] Relation: Sister  Code Status:   Allergies: Review of patient's allergies indicates no known allergies.  Chief Complaint  Patient presents with  . Medical Management of Chronic Issues    HPI: Patient is 75 y.o. female who is being seen for routine issues of HLD, DM2 and HTN.  Past Medical History  Diagnosis Date  . Diabetes mellitus   . Herpes simplex   . Hyperlipidemia   . Urinary tract infection   . Altered mental status   . Esophageal reflux   . Congenital coronary artery anomaly   . Cerebrovascular disease   . Depression   . Constipation   . Dementia   . Generalized abdominal pain     History reviewed. No pertinent past surgical history.    Medication List       This list is accurate as of: 04/26/15 11:59 PM.  Always use your most recent med list.               aspirin 325 MG EC tablet  Take 325 mg by mouth daily.     calcium carbonate 500 MG chewable tablet  Commonly known as:  TUMS - dosed in mg elemental calcium  Chew 1 tablet by mouth 2 (two) times daily.     cholecalciferol 1000 units tablet  Commonly known as:  VITAMIN D  Take 1,000 Units by mouth daily.     donepezil 10 MG tablet  Commonly known as:  ARICEPT  Take 5 mg by mouth at bedtime.     insulin aspart 100 UNIT/ML injection  Commonly known as:  novoLOG  Inject 5 Units into the skin 3 (three) times daily before meals. Only takes 5 units if cbg >150     insulin glargine 100 UNIT/ML injection  Commonly known as:  LANTUS  Inject 0.2 mLs (20 Units total) into the skin at bedtime.     lisinopril 10 MG  tablet  Commonly known as:  PRINIVIL,ZESTRIL  Take 1 tablet (10 mg total) by mouth daily.     Memantine HCl ER 21 MG Cp24  Commonly known as:  NAMENDA XR  Take 21 mg by mouth daily.     polyethylene glycol powder powder  Commonly known as:  GLYCOLAX/MIRALAX  Take 17 g by mouth daily.     ranitidine 75 MG tablet  Commonly known as:  ZANTAC  Take 150 mg by mouth at bedtime.     sertraline 50 MG tablet  Commonly known as:  ZOLOFT  Take 1.5 tablets (75 mg total) by mouth daily.     SYSTANE 0.4-0.3 % Soln  Generic drug:  Polyethyl Glycol-Propyl Glycol  Apply 1 drop to eye 3 (three) times daily.        No orders of the defined types were placed in this encounter.    Immunization History  Administered Date(s) Administered  . Influenza Whole 12/23/2012  . Pneumococcal Polysaccharide-23 02/18/2009    Social History  Substance Use Topics  . Smoking status: Former Games developer  . Smokeless tobacco: Not on file  . Alcohol Use: No    Review of Systems pt is not feeling social today;nursing  without concerns    Filed Vitals:   04/26/15 1613  BP: 138/72  Pulse: 68  Temp: 96.9 F (36.1 C)  Resp: 20    Physical Exam  GENERAL APPEARANCE: Alert, min conversant, No acute distress  SKIN: No diaphoresis rash HEENT: Unremarkable RESPIRATORY: Breathing is even, unlabored. Lung sounds are clear   CARDIOVASCULAR: Heart RRR no murmurs, rubs or gallops. No peripheral edema  GASTROINTESTINAL: Abdomen is soft, non-tender, not distended w/ normal bowel sounds.  GENITOURINARY: Bladder non tender, not distended  MUSCULOSKELETAL: No abnormal joints or musculature NEUROLOGIC: Cranial nerves 2-12 grossly intact. Moves all extremities PSYCHIATRIC: pt has a sheet over her head, no behavioral issues  Patient Active Problem List   Diagnosis Date Noted  . DM type 2, uncontrolled, with neuropathy (HCC) 12/17/2014  . Anemia in other chronic diseases classified elsewhere 12/03/2014  .  Osteoporosis 11/10/2014  . Dyslipidemia 06/17/2014  . History of stroke with residual deficit 04/04/2014  . Constipation due to slow transit 02/25/2014  . Anemia 04/22/2013  . Vascular dementia without behavioral disturbance 03/12/2013  . 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 Essential hypertension, benign 07/02/2012  . Unspecified vitamin D deficiency 07/02/2012  . Depression 07/02/2012  . GERD (gastroesophageal reflux disease) 07/02/2012    CBC    Component Value Date/Time   WBC 5.1 09/18/2013   WBC 5.0 08/27/2011 1803   RBC 3.84* 08/27/2011 1803   RBC 3.36* 01/19/2010 1309   HGB 12.9 10/29/2014 1327   HCT 38.0 10/29/2014 1327   PLT 275 09/18/2013   MCV 88.3 08/27/2011 1803   LYMPHSABS 1.8 08/27/2011 1803   MONOABS 0.4 08/27/2011 1803   EOSABS 0.1 08/27/2011 1803   BASOSABS 0.0 08/27/2011 1803    CMP     Component Value Date/Time   NA 143 10/29/2014 1327   NA 138 03/09/2014   K 4.9 10/29/2014 1327   CL 108 10/29/2014 1327   CO2 28 08/27/2011 1803   GLUCOSE 164* 10/29/2014 1327   BUN 26* 10/29/2014 1327   BUN 31* 03/09/2014   CREATININE 1.20* 10/29/2014 1327   CREATININE 2.0* 03/09/2014   CALCIUM 9.4 08/27/2011 1803   PROT 7.3 01/17/2010 1318   ALBUMIN 3.7 01/17/2010 1318   AST 23 01/17/2010 1318   ALT 16 01/17/2010 1318   ALKPHOS 69 01/17/2010 1318   BILITOT 0.5 01/17/2010 1318   GFRNONAA 85* 08/27/2011 1803   GFRAA >90 08/27/2011 1803    Assessment and Plan  Dyslipidemia ldl is 65; will stop her lipitor due to weight loss and loss of appetite    DM type 2, uncontrolled, with neuropathy (HCC) her hgb a1c is 7.5; will continue lantus 20 units daily and will continue novolog 5 units prior to meals for cbg >=150 will monitor    Essential hypertension, benign stable will continue lisinopril 10 mg daily; her urine micro-albumin is 1.2. Will not make changes will monitor her status.     Margit Hanks, MD

## 2015-05-02 ENCOUNTER — Encounter: Payer: Self-pay | Admitting: Internal Medicine

## 2015-05-02 NOTE — Assessment & Plan Note (Signed)
stable will continue lisinopril 10 mg daily; her urine micro-albumin is 1.2. Will not make changes will monitor her status.

## 2015-05-02 NOTE — Assessment & Plan Note (Signed)
ldl is 65; will stop her lipitor due to weight loss and loss of appetite

## 2015-05-02 NOTE — Assessment & Plan Note (Signed)
her hgb a1c is 7.5; will continue lantus 20 units daily and will continue novolog 5 units prior to meals for cbg >=150 will monitor

## 2015-05-20 ENCOUNTER — Non-Acute Institutional Stay (SKILLED_NURSING_FACILITY): Payer: Medicare Other | Admitting: Internal Medicine

## 2015-05-20 ENCOUNTER — Encounter: Payer: Self-pay | Admitting: Internal Medicine

## 2015-05-20 DIAGNOSIS — K219 Gastro-esophageal reflux disease without esophagitis: Secondary | ICD-10-CM | POA: Diagnosis not present

## 2015-05-20 DIAGNOSIS — R634 Abnormal weight loss: Secondary | ICD-10-CM | POA: Diagnosis not present

## 2015-05-20 DIAGNOSIS — I6930 Unspecified sequelae of cerebral infarction: Secondary | ICD-10-CM | POA: Diagnosis not present

## 2015-05-20 NOTE — Progress Notes (Signed)
MRN: [id number] Name: [name] [name]  Sex: female Age: 75 y.o. DOB: 09/30/1940  [facility]:216 Level Of Care: SNF Provider: Merrilee SeashoreALEXANDER, Hazim Treadway D Emergency Contacts: Extended Emergency Contact Information Primary Emergency Contact: Wynn,Blondine Address: [address]          [place] Home Phone: [phone] Work Phone: [phone] Relation: Sister  Code Status:   Allergies: Review of patient's allergies indicates no known allergies.  Chief Complaint  Patient presents with  . Medical Management of Chronic Issues    HPI: Patient is 75 y.o. female who is being seen for routine issues of GERD, s/p CVA and weight loss.  Past Medical History  Diagnosis Date  . Diabetes mellitus   . Herpes simplex   . Hyperlipidemia   . Urinary tract infection   . Altered mental status   . Esophageal reflux   . Congenital coronary artery anomaly   . Cerebrovascular disease   . Depression   . Constipation   . Dementia   . Generalized abdominal pain     No past surgical history on file.    Medication List       This list is accurate as of: 05/20/15 11:59 PM.  Always use your most recent med list.               aspirin 325 MG EC tablet  Take 325 mg by mouth daily.     calcium carbonate 500 MG chewable tablet  Commonly known as:  TUMS - dosed in mg elemental calcium  Chew 1 tablet by mouth 2 (two) times daily.     cholecalciferol 1000 units tablet  Commonly known as:  VITAMIN D  Take 1,000 Units by mouth daily.     donepezil 10 MG tablet  Commonly known as:  ARICEPT  Take 5 mg by mouth at bedtime.     insulin aspart 100 UNIT/ML injection  Commonly known as:  novoLOG  Inject 5 Units into the skin 3 (three) times daily before meals. Only takes 5 units if cbg >150     insulin glargine 100 UNIT/ML injection  Commonly known as:  LANTUS  Inject 0.2 mLs (20 Units total) into the skin at bedtime.     lisinopril 10 MG tablet   Commonly known as:  PRINIVIL,ZESTRIL  Take 1 tablet (10 mg total) by mouth daily.     Memantine HCl ER 21 MG Cp24  Commonly known as:  NAMENDA XR  Take 21 mg by mouth daily.     polyethylene glycol powder powder  Commonly known as:  GLYCOLAX/MIRALAX  Take 17 g by mouth daily.     ranitidine 75 MG tablet  Commonly known as:  ZANTAC  Take 150 mg by mouth at bedtime.     sertraline 50 MG tablet  Commonly known as:  ZOLOFT  Take 1.5 tablets (75 mg total) by mouth daily.     SYSTANE 0.4-0.3 % Soln  Generic drug:  Polyethyl Glycol-Propyl Glycol  Apply 1 drop to eye 3 (three) times daily.        No orders of the defined types were placed in this encounter.    Immunization History  Administered Date(s) Administered  . Influenza Whole 12/23/2012  . Pneumococcal Polysaccharide-23 02/18/2009    Social History  Substance Use Topics  . Smoking status: Former Games developermoker  . Smokeless tobacco: Not on file  . Alcohol Use: No    Review of Systems  UTO 2/2 dementia  Filed Vitals:   05/20/15 1245  BP: 121/78  Pulse: 68  Temp: 96.9 F (36.1 C)  Resp: 18    Physical Exam  GENERAL APPEARANCE: Alert, No acute distress  SKIN: No diaphoresis rash HEENT: Unremarkable RESPIRATORY: Breathing is even, unlabored. Lung sounds are clear   CARDIOVASCULAR: Heart RRR no murmurs, rubs or gallops. No peripheral edema  GASTROINTESTINAL: Abdomen is soft, non-tender, not distended w/ normal bowel sounds.  GENITOURINARY: Bladder non tender, not distended  MUSCULOSKELETAL: No abnormal joints or musculature NEUROLOGIC: Cranial nerves 2-12 grossly intact. Moves all extremities PSYCHIATRIC: Mood and affect appropriate to situation with dementia, no behavioral issues  Patient Active Problem List   Diagnosis Date Noted  . Weight loss 05/23/2015  . DM type 2, uncontrolled, with neuropathy (HCC) 12/17/2014  . Anemia in other chronic diseases classified elsewhere 12/03/2014  . Osteoporosis  11/10/2014  . Dyslipidemia 06/17/2014  . History of stroke with residual deficit 04/04/2014  . Constipation due to slow transit 02/25/2014  . Anemia 04/22/2013  . Vascular dementia without behavioral disturbance 03/12/2013  . 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 Essential hypertension, benign 07/02/2012  . Unspecified vitamin D deficiency 07/02/2012  . Depression 07/02/2012  . GERD (gastroesophageal reflux disease) 07/02/2012    CBC    Component Value Date/Time   WBC 5.1 09/18/2013   WBC 5.0 08/27/2011 1803   RBC 3.84* 08/27/2011 1803   RBC 3.36* 01/19/2010 1309   HGB 12.9 10/29/2014 1327   HCT 38.0 10/29/2014 1327   PLT 275 09/18/2013   MCV 88.3 08/27/2011 1803   LYMPHSABS 1.8 08/27/2011 1803   MONOABS 0.4 08/27/2011 1803   EOSABS 0.1 08/27/2011 1803   BASOSABS 0.0 08/27/2011 1803    CMP     Component Value Date/Time   NA 143 10/29/2014 1327   NA 138 03/09/2014   K 4.9 10/29/2014 1327   CL 108 10/29/2014 1327   CO2 28 08/27/2011 1803   GLUCOSE 164* 10/29/2014 1327   BUN 26* 10/29/2014 1327   BUN 31* 03/09/2014   CREATININE 1.20* 10/29/2014 1327   CREATININE 2.0* 03/09/2014   CALCIUM 9.4 08/27/2011 1803   PROT 7.3 01/17/2010 1318   ALBUMIN 3.7 01/17/2010 1318   AST 23 01/17/2010 1318   ALT 16 01/17/2010 1318   ALKPHOS 69 01/17/2010 1318   BILITOT 0.5 01/17/2010 1318   GFRNONAA 85* 08/27/2011 1803   GFRAA >90 08/27/2011 1803    Assessment and Plan  Weight loss Pt's fosamax and lipitor were d/c 2/2 to weight loss; will monitor wight off these drugs  GERD (gastroesophageal reflux disease) Controled on zantac 150 mg q HS and TUMS BID; cont current meds  History of stroke with residual deficit she is neurologically stable will continue her asa 325 mg daily and will monitor     Margit Hanks, MD

## 2015-05-23 ENCOUNTER — Encounter: Payer: Self-pay | Admitting: Internal Medicine

## 2015-05-23 DIAGNOSIS — R634 Abnormal weight loss: Secondary | ICD-10-CM | POA: Insufficient documentation

## 2015-05-23 NOTE — Assessment & Plan Note (Signed)
she is neurologically stable will continue her asa 325 mg daily and will monitor

## 2015-05-23 NOTE — Assessment & Plan Note (Signed)
Pt's fosamax and lipitor were d/c 2/2 to weight loss; will monitor wight off these drugs

## 2015-05-23 NOTE — Assessment & Plan Note (Addendum)
Controled on zantac 150 mg q HS and TUMS BID; cont current meds

## 2015-06-27 ENCOUNTER — Emergency Department (HOSPITAL_COMMUNITY): Payer: Medicaid Other

## 2015-06-27 ENCOUNTER — Encounter (HOSPITAL_COMMUNITY): Payer: Self-pay | Admitting: Emergency Medicine

## 2015-06-27 ENCOUNTER — Emergency Department (HOSPITAL_COMMUNITY)
Admission: EM | Admit: 2015-06-27 | Discharge: 2015-06-28 | Disposition: A | Discharge status: Home / Self Care | Payer: Medicaid Other | Attending: Emergency Medicine | Admitting: Emergency Medicine

## 2015-06-27 DIAGNOSIS — F0390 Unspecified dementia without behavioral disturbance: Secondary | ICD-10-CM | POA: Diagnosis not present

## 2015-06-27 DIAGNOSIS — E119 Type 2 diabetes mellitus without complications: Secondary | ICD-10-CM | POA: Insufficient documentation

## 2015-06-27 DIAGNOSIS — Z7982 Long term (current) use of aspirin: Secondary | ICD-10-CM | POA: Diagnosis not present

## 2015-06-27 DIAGNOSIS — J690 Pneumonitis due to inhalation of food and vomit: Secondary | ICD-10-CM | POA: Diagnosis not present

## 2015-06-27 DIAGNOSIS — K219 Gastro-esophageal reflux disease without esophagitis: Secondary | ICD-10-CM | POA: Diagnosis not present

## 2015-06-27 DIAGNOSIS — Z87891 Personal history of nicotine dependence: Secondary | ICD-10-CM | POA: Insufficient documentation

## 2015-06-27 DIAGNOSIS — Z8619 Personal history of other infectious and parasitic diseases: Secondary | ICD-10-CM | POA: Insufficient documentation

## 2015-06-27 DIAGNOSIS — Z794 Long term (current) use of insulin: Secondary | ICD-10-CM | POA: Insufficient documentation

## 2015-06-27 DIAGNOSIS — Q245 Malformation of coronary vessels: Secondary | ICD-10-CM | POA: Insufficient documentation

## 2015-06-27 DIAGNOSIS — Z87440 Personal history of urinary (tract) infections: Secondary | ICD-10-CM | POA: Diagnosis not present

## 2015-06-27 DIAGNOSIS — Z79899 Other long term (current) drug therapy: Secondary | ICD-10-CM | POA: Diagnosis not present

## 2015-06-27 DIAGNOSIS — R05 Cough: Secondary | ICD-10-CM | POA: Diagnosis present

## 2015-06-27 DIAGNOSIS — F329 Major depressive disorder, single episode, unspecified: Secondary | ICD-10-CM | POA: Insufficient documentation

## 2015-06-27 MED ORDER — AMOXICILLIN-POT CLAVULANATE 875-125 MG PO TABS: 1.0000 | ORAL_TABLET | Freq: Two times a day (BID) | ORAL | Status: DC

## 2015-06-27 NOTE — ED Provider Notes (Signed)
CSN: [id number]     Arrival date & time 06/27/15  1902 History   First MD Initiated Contact with Patient 06/27/15 1918     Chief Complaint  Patient presents with  . Hypotension     (Consider location/radiation/quality/duration/timing/severity/associated sxs/prior Treatment) Patient is a 75 y.o. female presenting with shortness of breath. The history is provided by the patient. No language interpreter was used.  Shortness of Breath Severity:  Moderate Onset quality:  Gradual Duration:  1 hour Timing:  Constant Progression:  Worsening Chronicity:  New Context: not URI   Relieved by:  Nothing Worsened by:  Nothing tried Ineffective treatments:  None tried Associated symptoms: cough   Risk factors: no recent alcohol use   Pt is a resident of [facility].  EMS reports patient was unresponsive and blood pressure was low.  Patient is currently responsive. Pt was reported to have choked on a piece of candy. EMS reported pt coughed up candy  Past Medical History  Diagnosis Date  . Diabetes mellitus   . Herpes simplex   . Hyperlipidemia   . Urinary tract infection   . Altered mental status   . Esophageal reflux   . Congenital coronary artery anomaly   . Cerebrovascular disease   . Depression   . Constipation   . Dementia   . Generalized abdominal pain    History reviewed. No pertinent past surgical history. No family history on file. Social History  Substance Use Topics  . Smoking status: Former Games developer  . Smokeless tobacco: None  . Alcohol Use: No   OB History    No data available     Review of Systems  Respiratory: Positive for cough and shortness of breath.   All other systems reviewed and are negative.     Allergies  Review of patient's allergies indicates no known allergies.  Home Medications   Prior to Admission medications   Medication Sig Start Date End Date Taking? Authorizing Provider  aspirin 325 MG EC tablet Take 325 mg by mouth daily.    Yes Historical Provider, MD  calcium carbonate (TUMS - DOSED IN MG ELEMENTAL CALCIUM) 500 MG chewable tablet Chew 2 tablets by mouth 2 (two) times daily.    Yes Historical Provider, MD  cholecalciferol (VITAMIN D) 1000 UNITS tablet Take 1,000 Units by mouth daily.    Yes Historical Provider, MD  donepezil (ARICEPT) 5 MG tablet Take 5 mg by mouth at bedtime.   Yes Historical Provider, MD  insulin aspart (NOVOLOG) 100 UNIT/ML injection Inject 5 Units into the skin 3 (three) times daily before meals. Only takes 5 units if cbg >150   Yes Historical Provider, MD  insulin glargine (LANTUS) 100 UNIT/ML injection Inject 0.2 mLs (20 Units total) into the skin at bedtime. 12/31/13  Yes Tiffany L Reed, DO  lisinopril (PRINIVIL,ZESTRIL) 10 MG tablet Take 1 tablet (10 mg total) by mouth daily. 09/17/13  Yes Tiffany L Reed, DO  Memantine HCl ER (NAMENDA XR) 21 MG CP24 Take 21 mg by mouth daily. 03/14/15  Yes Sharee Holster, NP  Polyethyl Glycol-Propyl Glycol (SYSTANE) 0.4-0.3 % SOLN Apply 1 drop to eye 3 (three) times daily.   Yes Historical Provider, MD  polyethylene glycol powder (GLYCOLAX/MIRALAX) powder Take 17 g by mouth daily. 02/25/14  Yes Sharee Holster, NP  ranitidine (ZANTAC) 75 MG tablet Take 150 mg by mouth at bedtime.   Yes Historical Provider, MD  sertraline (ZOLOFT) 50 MG tablet Take 1.5 tablets (75 mg total)  by mouth daily. Patient taking differently: Take 50 mg by mouth at bedtime.  03/14/15  Yes Sharee Holstereborah S Green, NP   BP 146/69 mmHg  Pulse 75  Temp(Src) 97.5 F (36.4 C) (Oral)  Resp 17  SpO2 96% Physical Exam  Constitutional: She appears well-developed and well-nourished.  HENT:  Head: Normocephalic.  Right Ear: External ear normal.  Left Ear: External ear normal.  Mouth/Throat: Oropharynx is clear and moist.  Eyes: Conjunctivae are normal. Pupils are equal, round, and reactive to light.  Neck: Normal range of motion.  Cardiovascular: Normal rate and regular rhythm.   Pulmonary/Chest:  Effort normal.  Abdominal: Soft.  Musculoskeletal: Normal range of motion.  Neurological: She is alert.  Skin: Skin is warm.  Nursing note and vitals reviewed.   ED Course  Procedures (including critical care time) Labs Review Labs Reviewed - No data to display  Imaging Review Dg Chest 2 View  06/27/2015  CLINICAL DATA:  Possible aspiration. Nursing home patient post recent choking incident. EXAM: CHEST  2 VIEW COMPARISON:  10/29/2014 FINDINGS: Bibasilar atelectasis, which can be seen in the setting of aspiration. Lung volumes are low. Heart at the upper limits normal in size, mild tortuosity of the thoracic aorta. No pleural effusion or pneumothorax. IMPRESSION: Bibasilar atelectasis, can be seen in the setting of aspiration. Electronically Signed   By: Rubye OaksMelanie  Ehinger M.D.   On: 06/27/2015 20:38   I have personally reviewed and evaluated these images and lab results as part of my medical decision-making.   EKG Interpretation None      MDM   Final diagnoses:  Aspiration pneumonia of both lungs, unspecified aspiration pneumonia type, unspecified part of lung [facility](HCC)    An After Visit Summary was printed and given to the patient. Meds ordered this encounter  Medications  . donepezil (ARICEPT) 5 MG tablet    Sig: Take 5 mg by mouth at bedtime.  Marland Kitchen. amoxicillin-clavulanate (AUGMENTIN) 875-125 MG tablet    Sig: Take 1 tablet by mouth every 12 (twelve) hours.    Dispense:  14 tablet    Refill:  0    Order Specific Question:  Supervising Provider    Answer:  Eber HongMILLER, BRIAN [[id number]]     Lonia SkinnerLeslie K West MarionSofia, PA-C 06/27/15 2209  Lavera Guiseana Duo Liu, MD 06/28/15 [phone]

## 2015-06-27 NOTE — ED Notes (Signed)
Called PTAR @ 2236

## 2015-06-27 NOTE — Discharge Instructions (Signed)
Aspiration Pneumonia  Aspiration pneumonia is an infection in your lungs. It occurs when food, liquid, or stomach contents (vomit) are inhaled (aspirated) into your lungs. When these things get into your lungs, swelling (inflammation) and infection can occur. This can make it difficult for you to breathe. Aspiration pneumonia is a serious condition and can be life threatening. RISK FACTORS Aspiration pneumonia is more likely to occur when a person's cough (gag) reflex or ability to swallow has been decreased. Some things that can do this include:   Having a brain injury or disease, such as stroke, seizures, Parkinson's disease, dementia, or amyotrophic lateral sclerosis (ALS).   Being given general anesthetic for procedures.   Being in a coma (unconscious).   Having a narrowing of the tube that carries food to the stomach (esophagus).   Drinking too much alcohol. If a person passes out and vomits, vomit can be swallowed into the lungs.   Taking certain medicines, such as tranquilizers or sedatives.  SIGNS AND SYMPTOMS   Coughing after swallowing food or liquids.   Breathing problems, such as wheezing or shortness of breath.   Bluish skin. This can be caused by lack of oxygen.   Coughing up food or mucus. The mucus might contain blood, greenish material, or yellowish-white fluid (pus).   Fever.   Chest pain.   Being more tired than usual (fatigue).   Sweating more than usual.   Bad breath.  DIAGNOSIS  A physical exam will be done. During the exam, the health care provider will listen to your lungs with a stethoscope to check for:   Crackling sounds in the lungs.  Decreased breath sounds.  A rapid heartbeat. Various tests may be ordered. These may include:   Chest X-ray.   CT scan.   Swallowing study. This test looks at how food is swallowed and whether it goes into your breathing tube (trachea) or food pipe (esophagus).   Sputum culture. Saliva and  mucus (sputum) are collected from the lungs or the tubes that carry air to the lungs (bronchi). The sputum is then tested for bacteria.   Bronchoscopy. This test uses a flexible tube (bronchoscope) to see inside the lungs. TREATMENT  Treatment will usually include antibiotic medicines. Other medicines may also be used to reduce fever or pain. You may need to be treated in the hospital. In the hospital, your breathing will be carefully monitored. Depending on how well you are breathing, you may need to be given oxygen, or you may need breathing support from a breathing machine (ventilator). For people who fail a swallowing study, a feeding tube might be placed in the stomach, or they may be asked to avoid certain food textures or liquids when they eat. HOME CARE INSTRUCTIONS   Carefully follow any special eating instructions you were given, such as avoiding certain food textures or thickening liquids. This reduces the risk of developing aspiration pneumonia again.  Only take over-the-counter or prescription medicines as directed by your health care provider. Follow the directions carefully.   If you were prescribed antibiotics, take them as directed. Finish them even if you start to feel better.   Rest as instructed by your health care provider.   Keep all follow-up appointments with your health care provider.  SEEK MEDICAL CARE IF:   You develop worsening shortness of breath, wheezing, or difficulty breathing.   You develop a fever.   You have chest pain.  MAKE SURE YOU:   Understand these instructions.  Will watch   your condition.  Will get help right away if you are not doing well or get worse.   This information is not intended to replace advice given to you by your health care provider. Make sure you discuss any questions you have with your health care provider.   Document Released: 12/25/2008 Document Revised: 03/04/2013 Document Reviewed: 08/15/2012 Elsevier  Interactive Patient Education 2016 Elsevier Inc.  

## 2015-06-27 NOTE — ED Notes (Signed)
Bed: WA01 Expected date: 06/27/15 Expected time: 6:51 PM Means of arrival:  Comments: Hypotension, choking episode

## 2015-06-27 NOTE — ED Notes (Signed)
Pt from [facility] with c/o hypotension. EMS was called to facility for pt being unresponsive. Pt unknowing to family and staff had gotten choked on a hard candy. Pt had coughed up the candy before EMS had arrived, however her systolic bp was in the 70s. EMS's last bp was 125/61. Pt has dementia at baseline and has right sided deficits at baseline from a former stroke. Pt was going to stay at facility and have a chest xray to rule out any aspiration, however her hypotension led to her being transported here

## 2015-06-29 LAB — HM DIABETES EYE EXAM

## 2015-06-30 ENCOUNTER — Encounter: Payer: Self-pay | Admitting: Adult Health

## 2015-06-30 ENCOUNTER — Non-Acute Institutional Stay (SKILLED_NURSING_FACILITY): Payer: Medicare Other | Admitting: Adult Health

## 2015-06-30 DIAGNOSIS — Z794 Long term (current) use of insulin: Secondary | ICD-10-CM | POA: Diagnosis not present

## 2015-06-30 DIAGNOSIS — K5901 Slow transit constipation: Secondary | ICD-10-CM

## 2015-06-30 DIAGNOSIS — F0150 Vascular dementia without behavioral disturbance: Secondary | ICD-10-CM | POA: Diagnosis not present

## 2015-06-30 DIAGNOSIS — R634 Abnormal weight loss: Secondary | ICD-10-CM | POA: Diagnosis not present

## 2015-06-30 DIAGNOSIS — E1140 Type 2 diabetes mellitus with diabetic neuropathy, unspecified: Secondary | ICD-10-CM

## 2015-06-30 DIAGNOSIS — K219 Gastro-esophageal reflux disease without esophagitis: Secondary | ICD-10-CM

## 2015-06-30 DIAGNOSIS — I10 Essential (primary) hypertension: Secondary | ICD-10-CM

## 2015-06-30 DIAGNOSIS — M810 Age-related osteoporosis without current pathological fracture: Secondary | ICD-10-CM

## 2015-06-30 DIAGNOSIS — I6930 Unspecified sequelae of cerebral infarction: Secondary | ICD-10-CM | POA: Diagnosis not present

## 2015-06-30 NOTE — Progress Notes (Signed)
Patient ID: [name] [name], female   DOB: 08-26-1940, 75 y.o.   MRN: [id number]   Facility:  [facility]    CODE STATUS: FULL   No Known Allergies  Chief Complaint  Patient presents with  . Annual Exam     HPI:  She is a long term resident of this facility being seen for her annual exam. Overall she has had a slow decline in her status; she is slowly losing weight. She is presently being treated for pneumonia with augmentin. She is spending more time in bed per her choice. She is unable to fully participate in the hpi or ros. There are no nursing concerns at this time.    Past Medical History  Diagnosis Date  . Diabetes mellitus   . Herpes simplex   . Hyperlipidemia   . Urinary tract infection   . Altered mental status   . Esophageal reflux   . Congenital coronary artery anomaly   . Cerebrovascular disease   . Depression   . Constipation   . Dementia   . Generalized abdominal pain     History reviewed. No pertinent past surgical history.    History reviewed. No pertinent family history.  Social History   Social History  . Marital Status: Widowed    Spouse Name: N/A  . Number of Children: N/A  . Years of Education: N/A   Occupational History  . Not on file.   Social History Main Topics  . Smoking status: Former Games developer  . Smokeless tobacco: Not on file  . Alcohol Use: No  . Drug Use: No  . Sexual Activity: Not on file   Other Topics Concern  . Not on file   Social History Narrative     Immunization History  Administered Date(s) Administered  . Influenza Whole 12/23/2012  . Influenza-Unspecified 04/20/2015  . Pneumococcal Polysaccharide-23 02/18/2009     VITAL SIGNS BP 127/69 mmHg  Pulse 87  Temp(Src) 97.8 F (36.6 C) (Oral)  Resp 18  Ht  (1.626 m)  Wt 170 lb 4 oz (77.225 kg)  BMI 29.21 kg/m2  SpO2 97%  Patient's Medications  New Prescriptions   No medications on file  Previous Medications   AMOXICILLIN-CLAVULANATE (AUGMENTIN)  875-125 MG TABLET    Take 1 tablet by mouth every 12 (twelve) hours.   ASPIRIN 325 MG EC TABLET    Take 325 mg by mouth daily.   CALCIUM CARBONATE (TUMS - DOSED IN MG ELEMENTAL CALCIUM) 500 MG CHEWABLE TABLET    Chew 2 tablets by mouth 2 (two) times daily.    CHOLECALCIFEROL (VITAMIN D) 1000 UNITS TABLET    Take 1,000 Units by mouth daily.    DONEPEZIL (ARICEPT) 5 MG TABLET    Take 5 mg by mouth at bedtime.   INSULIN ASPART (NOVOLOG) 100 UNIT/ML INJECTION    Inject 5 Units into the skin 3 (three) times daily before meals. Only takes 5 units if cbg >150   INSULIN GLARGINE (LANTUS) 100 UNIT/ML INJECTION    Inject 0.2 mLs (20 Units total) into the skin at bedtime.   LISINOPRIL (PRINIVIL,ZESTRIL) 10 MG TABLET    Take 1 tablet (10 mg total) by mouth daily.   MEMANTINE HCL ER (NAMENDA XR) 21 MG CP24    Take 21 mg by mouth daily.   POLYETHYL GLYCOL-PROPYL GLYCOL (SYSTANE) 0.4-0.3 % SOLN    Apply 1 drop to eye 3 (three) times daily.   POLYETHYLENE GLYCOL POWDER (GLYCOLAX/MIRALAX) POWDER    Take 17  g by mouth daily.   RANITIDINE (ZANTAC) 75 MG TABLET    Take 150 mg by mouth at bedtime.   SERTRALINE (ZOLOFT) 50 MG TABLET    Take 1.5 tablets (75 mg total) by mouth daily.  Modified Medications   No medications on file  Discontinued Medications   No medications on file     SIGNIFICANT DIAGNOSTIC EXAMS  07-13-14: dexa: t score -2.5  08-05-14: mammogram: No mammographic evidence of malignancy. A result letter of this screening mammogram will be mailed directly to the patient.  10-29-14: chest x-ray: 1. No acute findings. 2. Atherosclerotic aortic arch.    LABS REVIEWED:   07-29-14: glucose 88; bun 14.9; creat 0.86; k+4.5; na++141; liver normal albumin 3.6  09-05-14: hgb a1c 8.2  10-07-14: wbc 4.4; hgb 9.9; hct 33.4; mcv 90.2 plt 149; glucose 67; bun 24; creat 0.94; k+ 4.5; na++142; liver normal albumin 3.3 10-09-14: urine for micro-albumin 1.2 (2015: 2.5 ) 10-29-14: hgb 12.9; hct 38.0  glucose 164; bun  26; creat 1.20; k+4.9; na++143;  12-16-14: hgb a1c 7.5  02-25-15: wbc 5.1; hgb 9.8; hct 31.5; mcv 87.9; plt 164; glucose 73; bun 32.7; creat 1.00; k+ 4.5; na++143; ;liver normal albumin 3.2; tsh 0.47; vit B12: 490; folic acid 7.8; free T4: 0.97 03-25-15: chol 208; ldl 125; trig 144; hdl 55    .   Review of Systems Unable to perform ROS: Dementia    Physical Exam Constitutional: No distress.  Eyes: Conjunctivae are normal.  Neck: Neck supple. No JVD present. No thyromegaly present.  Cardiovascular: Normal rate, regular rhythm and intact distal pulses.   Respiratory: Effort normal and breath sounds normal. No respiratory distress. She has no wheezes.  GI: Soft. Bowel sounds are normal. She exhibits no distension. There is no tenderness.  Musculoskeletal: She exhibits no edema.  Able to move all extremities   Lymphadenopathy:    She has no cervical adenopathy.  Neurological: She is alert.  Skin: Skin is warm and dry. She is not diaphoretic.  Psychiatric: She has a normal mood and affect.       ASSESSMENT/ PLAN:  1.  Dyslipidemia: her ldl is 125; is off lipitor due to her weight and appetite changes.   2. Diabetes: her hgb a1c is 7.5; will continue lantus 20 units daily and will continue novolog 5 units prior to meals for cbg >=150 will monitor   3. Hypertension: is stable will continue lisinopril 10 mg daily; her urine micro-albumin is 1.2. Will not make changes will monitor her status.   4. Vascular dementia: no significant change in her status; will continue  aricept 5 mg nightly will increase her namenda to 21 mg daily  will monitor; her current weight is 170 pounds and is stable  5. Depression: will continue zoloft 75 mg daily will monitor   6. Osteoporosis: will continue vit d 100 units daily and calcium 500 mg twice daily  t score is -2.5 her fosamax was stopped due to lowered appetite and weight loss   7. Pneumonia: will complete her agumentin and will monitor       Will check hgb a1c and cologuard     Time spent with patient 45 minutes >50% time spent counseling; reviewing medical record; tests; labs; and developing future plan of care    Synthia Innocenteborah Green NP [facility]  Contact [phone] Monday through Friday 8am- 5pm  After hours call [phone]

## 2015-07-01 LAB — HEMOGLOBIN A1C: Hemoglobin A1C: 6.8

## 2015-07-26 ENCOUNTER — Non-Acute Institutional Stay (SKILLED_NURSING_FACILITY): Payer: Medicare Other | Admitting: Internal Medicine

## 2015-07-26 ENCOUNTER — Encounter: Payer: Self-pay | Admitting: Internal Medicine

## 2015-07-26 DIAGNOSIS — M810 Age-related osteoporosis without current pathological fracture: Secondary | ICD-10-CM | POA: Diagnosis not present

## 2015-07-26 DIAGNOSIS — F329 Major depressive disorder, single episode, unspecified: Secondary | ICD-10-CM | POA: Diagnosis not present

## 2015-07-26 DIAGNOSIS — F32A Depression, unspecified: Secondary | ICD-10-CM

## 2015-07-26 DIAGNOSIS — F0150 Vascular dementia without behavioral disturbance: Secondary | ICD-10-CM

## 2015-07-26 NOTE — Assessment & Plan Note (Signed)
continue vit d 100 units daily and calcium 500 mg twice daily t score is -2.5 her fosamax was stopped due to lowered appetite and weight loss

## 2015-07-26 NOTE — Assessment & Plan Note (Signed)
no significant change in her status; will continue aricept 5 mg nightly will increase her namenda to 21 mg daily will monitor; her current weight is 170 pounds and is stable

## 2015-07-26 NOTE — Progress Notes (Signed)
MRN: [id number] Name: [name] [name]  Sex: female Age: 75 y.o. DOB: 10/17/1940  [facility] Facility[facility] Level Of Care: SNF Provider: Margit HanksAlexander, Suhaylah Wampole D  Emergency Contacts: Extended Emergency Contact Information Primary Emergency Contact: Wynn,Blondine Address: [address]          [place] Home Phone: [phone] Work Phone: [phone] Relation: Sister  Code Status: Full Code  Allergies: Review of patient's allergies indicates no known allergies.  Chief Complaint  Patient presents with  . Medical Management of Chronic Issues    Routine Visit    HPI: Patient is 75 y.o. female who is being seen for routine issues of vascular dementia, osteoporosis and depression.  Past Medical History  Diagnosis Date  . Diabetes mellitus   . Herpes simplex   . Hyperlipidemia   . Urinary tract infection   . Altered mental status   . Esophageal reflux   . Congenital coronary artery anomaly   . Cerebrovascular disease   . Depression   . Constipation   . Dementia   . Generalized abdominal pain     History reviewed. No pertinent past surgical history.    Medication List       This list is accurate as of: 07/26/15  9:21 PM.  Always use your most recent med list.               aspirin 325 MG EC tablet  Take 325 mg by mouth daily.     calcium carbonate 500 MG chewable tablet  Commonly known as:  TUMS - dosed in mg elemental calcium  Chew 2 tablets by mouth 2 (two) times daily.     cholecalciferol 1000 units tablet  Commonly known as:  VITAMIN D  Take 1,000 Units by mouth daily.     donepezil 5 MG tablet  Commonly known as:  ARICEPT  Take 5 mg by mouth at bedtime.     insulin aspart 100 UNIT/ML injection  Commonly known as:  novoLOG  Inject 5 Units into the skin 3 (three) times daily before meals. Only takes 5 units if cbg >150     insulin glargine 100 UNIT/ML injection  Commonly known as:  LANTUS  Inject 0.2 mLs (20  Units total) into the skin at bedtime.     lisinopril 10 MG tablet  Commonly known as:  PRINIVIL,ZESTRIL  Take 1 tablet (10 mg total) by mouth daily.     Memantine HCl ER 21 MG Cp24  Commonly known as:  NAMENDA XR  Take 21 mg by mouth daily.     polyethylene glycol powder powder  Commonly known as:  GLYCOLAX/MIRALAX  Take 17 g by mouth daily.     ranitidine 75 MG tablet  Commonly known as:  ZANTAC  Take 150 mg by mouth at bedtime.     sertraline 50 MG tablet  Commonly known as:  ZOLOFT  Take 50 mg by mouth daily.     SYSTANE 0.4-0.3 % Soln  Generic drug:  Polyethyl Glycol-Propyl Glycol  Apply 1 drop to eye 3 (three) times daily.        Meds ordered this encounter  Medications  . sertraline (ZOLOFT) 50 MG tablet    Sig: Take 50 mg by mouth daily.    Immunization History  Administered Date(s) Administered  . Influenza Whole 12/23/2012  . Influenza-Unspecified 04/20/2015  . Pneumococcal Polysaccharide-23 02/18/2009    Social History  Substance Use Topics  . Smoking status: Former Games developermoker  .  Smokeless tobacco: Not on file  . Alcohol Use: No    Review of Systems  UTO 2/2 dementia    Filed Vitals:   07/26/15 1446  BP: 161/80  Pulse: 70  Temp: 98 F (36.7 C)  Resp: 16    Physical Exam  GENERAL APPEARANCE: Alert,  No acute distress  SKIN: No diaphoresis rash HEENT: Unremarkable RESPIRATORY: Breathing is even, unlabored. Lung sounds are clear   CARDIOVASCULAR: Heart RRR no murmurs, rubs or gallops. No peripheral edema  GASTROINTESTINAL: Abdomen is soft, non-tender, not distended w/ normal bowel sounds.  GENITOURINARY: Bladder non tender, not distended  MUSCULOSKELETAL: No abnormal joints or musculature NEUROLOGIC: Cranial nerves 2-12 grossly intact. Moves all extremities PSYCHIATRIC: Mood and affect appropriate to situation with dementia, no behavioral issues  Patient Active Problem List   Diagnosis Date Noted  . Weight loss 05/23/2015  . DM type 2,  uncontrolled, with neuropathy (HCC) 12/17/2014  . Anemia in other chronic diseases classified elsewhere 12/03/2014  . Osteoporosis 11/10/2014  . Dyslipidemia 06/17/2014  . History of stroke with residual deficit 04/04/2014  . Constipation due to slow transit 02/25/2014  . Anemia 04/22/2013  . Vascular dementia without behavioral disturbance 03/12/2013  . 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 Essential hypertension, benign 07/02/2012  . Unspecified vitamin D deficiency 07/02/2012  . Depression 07/02/2012  . GERD (gastroesophageal reflux disease) 07/02/2012    CBC    Component Value Date/Time   WBC 5.1 02/25/2015   WBC 5.0 08/27/2011 1803   RBC 3.84* 08/27/2011 1803   RBC 3.36* 01/19/2010 1309   HGB 9.8* 02/25/2015   HCT 32* 02/25/2015   PLT 164 02/25/2015   MCV 88.3 08/27/2011 1803   LYMPHSABS 1.8 08/27/2011 1803   MONOABS 0.4 08/27/2011 1803   EOSABS 0.1 08/27/2011 1803   BASOSABS 0.0 08/27/2011 1803    CMP     Component Value Date/Time   NA 143 02/25/2015   NA 143 10/29/2014 1327   K 4.5 02/25/2015   CL 108 10/29/2014 1327   CO2 28 08/27/2011 1803   GLUCOSE 164* 10/29/2014 1327   BUN 33* 02/25/2015   BUN 26* 10/29/2014 1327   CREATININE 1.0 02/25/2015   CREATININE 1.20* 10/29/2014 1327   CALCIUM 9.4 08/27/2011 1803   PROT 7.3 01/17/2010 1318   ALBUMIN 3.7 01/17/2010 1318   AST 11* 02/25/2015   ALT 7 02/25/2015   ALKPHOS 68 02/25/2015   BILITOT 0.5 01/17/2010 1318   GFRNONAA 85* 08/27/2011 1803   GFRAA >90 08/27/2011 1803    Assessment and Plan  Vascular dementia without behavioral disturbance no significant change in her status; will continue aricept 5 mg nightly will increase her namenda to 21 mg daily will monitor; her current weight is 170 pounds and is stable  Osteoporosis continue vit d 100 units daily and calcium 500 mg twice daily t score is -2.5 her fosamax was stopped due to lowered appetite  and weight loss   Depression Zoloft has been GDR to 50 mg and pt still stable;cont zoloft 50 mg qHS    Margit Hanks, MD

## 2015-07-26 NOTE — Assessment & Plan Note (Signed)
Zoloft has been GDR to 50 mg and pt still stable;cont zoloft 50 mg qHS

## 2015-08-31 ENCOUNTER — Encounter: Payer: Self-pay | Admitting: Adult Health

## 2015-08-31 ENCOUNTER — Non-Acute Institutional Stay (SKILLED_NURSING_FACILITY): Payer: Medicare Other | Admitting: Adult Health

## 2015-08-31 DIAGNOSIS — I6930 Unspecified sequelae of cerebral infarction: Secondary | ICD-10-CM | POA: Diagnosis not present

## 2015-08-31 DIAGNOSIS — I10 Essential (primary) hypertension: Secondary | ICD-10-CM | POA: Diagnosis not present

## 2015-08-31 DIAGNOSIS — K219 Gastro-esophageal reflux disease without esophagitis: Secondary | ICD-10-CM

## 2015-08-31 DIAGNOSIS — M810 Age-related osteoporosis without current pathological fracture: Secondary | ICD-10-CM

## 2015-08-31 DIAGNOSIS — F0150 Vascular dementia without behavioral disturbance: Secondary | ICD-10-CM | POA: Diagnosis not present

## 2015-08-31 DIAGNOSIS — E1140 Type 2 diabetes mellitus with diabetic neuropathy, unspecified: Secondary | ICD-10-CM

## 2015-08-31 DIAGNOSIS — E785 Hyperlipidemia, unspecified: Secondary | ICD-10-CM

## 2015-08-31 DIAGNOSIS — K5901 Slow transit constipation: Secondary | ICD-10-CM

## 2015-08-31 DIAGNOSIS — D638 Anemia in other chronic diseases classified elsewhere: Secondary | ICD-10-CM

## 2015-08-31 DIAGNOSIS — Z794 Long term (current) use of insulin: Secondary | ICD-10-CM

## 2015-08-31 DIAGNOSIS — E1169 Type 2 diabetes mellitus with other specified complication: Secondary | ICD-10-CM | POA: Diagnosis not present

## 2015-08-31 NOTE — Progress Notes (Signed)
Location:  [facility] Room Number: 222 A Place of Service:      CODE STATUS: full code   No Known Allergies  Chief Complaint  Patient presents with  . Medical Management of Chronic Issues    Routine Visit    HPI:  She is a lon term resident of this facility being seen for the management of her chronic illnesses. Overall her status is stable. She tells me that she is feeling good; she cannot fully participate in the hpi or ros. There are no nursing concerns at this time.    Past Medical History  Diagnosis Date  . Diabetes mellitus   . Herpes simplex   . Hyperlipidemia   . Urinary tract infection   . Altered mental status   . Esophageal reflux   . Congenital coronary artery anomaly   . Cerebrovascular disease   . Depression   . Constipation   . Dementia   . Generalized abdominal pain     History reviewed. No pertinent past surgical history.  Social History   Social History  . Marital Status: Widowed    Spouse Name: N/A  . Number of Children: N/A  . Years of Education: N/A   Occupational History  . Not on file.   Social History Main Topics  . Smoking status: Former Games developer  . Smokeless tobacco: Not on file  . Alcohol Use: No  . Drug Use: No  . Sexual Activity: Not on file   Other Topics Concern  . Not on file   Social History Narrative   History reviewed. No pertinent family history.    VITAL SIGNS BP 143/67 mmHg  Pulse 73  Temp(Src) 98.3 F (36.8 C) (Oral)  Resp 16  Ht  (1.626 m)  Wt 173 lb (78.472 kg)  BMI 29.68 kg/m2  Patient's Medications  New Prescriptions   No medications on file  Previous Medications   ASPIRIN 325 MG EC TABLET    Take 325 mg by mouth daily.   CALCIUM CARBONATE (TUMS - DOSED IN MG ELEMENTAL CALCIUM) 500 MG CHEWABLE TABLET    Chew 2 tablets by mouth 2 (two) times daily.    CHOLECALCIFEROL (VITAMIN D) 1000 UNITS TABLET    Take 1,000 Units by mouth daily.    DONEPEZIL  (ARICEPT) 5 MG TABLET    Take 5 mg by mouth at bedtime.   INSULIN ASPART (NOVOLOG) 100 UNIT/ML INJECTION    Inject 5 Units into the skin 3 (three) times daily before meals. Only takes 5 units if cbg >150   INSULIN GLARGINE (LANTUS) 100 UNIT/ML INJECTION    Inject 0.2 mLs (20 Units total) into the skin at bedtime.   LISINOPRIL (PRINIVIL,ZESTRIL) 10 MG TABLET    Take 1 tablet (10 mg total) by mouth daily.   MEMANTINE HCL ER (NAMENDA XR) 21 MG CP24    Take 21 mg by mouth daily.   POLYETHYL GLYCOL-PROPYL GLYCOL (SYSTANE) 0.4-0.3 % SOLN    Apply 1 drop to eye 3 (three) times daily.   POLYETHYLENE GLYCOL POWDER (GLYCOLAX/MIRALAX) POWDER    Take 17 g by mouth daily.   RANITIDINE (ZANTAC) 75 MG TABLET    Take 150 mg by mouth at bedtime.   SERTRALINE (ZOLOFT) 50 MG TABLET    Take 50 mg by mouth daily.  Modified Medications   No medications on file  Discontinued Medications   No medications on file     SIGNIFICANT DIAGNOSTIC EXAMS  07-13-14:  dexa: t score -2.5  08-05-14: mammogram: No mammographic evidence of malignancy. A result letter of this screening mammogram will be mailed directly to the patient.  10-29-14: chest x-ray: 1. No acute findings. 2. Atherosclerotic aortic arch.    LABS REVIEWED:   10-07-14: wbc 4.4; hgb 9.9; hct 33.4; mcv 90.2 plt 149; glucose 67; bun 24; creat 0.94; k+ 4.5; na++142; liver normal albumin 3.3 10-09-14: urine for micro-albumin 1.2 (2015: 2.5 ) 10-29-14: hgb 12.9; hct 38.0  glucose 164; bun 26; creat 1.20; k+4.9; na++143;  12-16-14: hgb a1c 7.5  02-25-15: wbc 5.1; hgb 9.8; hct 31.5; mcv 87.9; plt 164; glucose 73; bun 32.7; creat 1.00; k+ 4.5; na++143; ;liver normal albumin 3.2; tsh 0.47; vit B12: 490; folic acid 7.8; free T4: 0.97 03-25-15: chol 208; ldl 125; trig 144; hdl 55  07-01-15: hgb a1c 6.8    .   Review of Systems Unable to perform ROS: Dementia    Physical Exam Constitutional: No distress.  Eyes: Conjunctivae are normal.  Neck: Neck supple. No  JVD present. No thyromegaly present.  Cardiovascular: Normal rate, regular rhythm and intact distal pulses.   Respiratory: Effort normal and breath sounds normal. No respiratory distress. She has no wheezes.  GI: Soft. Bowel sounds are normal. She exhibits no distension. There is no tenderness.  Musculoskeletal: She exhibits no edema.  Able to move all extremities   Lymphadenopathy:    She has no cervical adenopathy.  Neurological: She is alert.  Skin: Skin is warm and dry. She is not diaphoretic.  Psychiatric: She has a normal mood and affect.       ASSESSMENT/ PLAN:  1.  Dyslipidemia: her ldl is 125; is off lipitor due to her weight and appetite changes.   2. Diabetes: her hgb a1c is 6.8; will continue lantus 20 units daily and will continue novolog 5 units prior to meals for cbg >=150 will monitor   3. Hypertension: is stable will continue lisinopril 10 mg daily; her urine micro-albumin is 1.2. Will not make changes will monitor her status.   4. Vascular dementia: no significant change in her status; will continue  aricept 5 mg nightly will increase her namenda to 21 mg daily  will monitor; her current weight is 170 pounds and is stable  5. Depression: will continue zoloft 75 mg daily will monitor   6. Osteoporosis: will continue vit d 100 units daily and calcium 500 mg twice daily  t score is -2.5 her fosamax was stopped due to lowered appetite and weight loss     Will check cbc; cmp; lipids hgb a1c and screening mammogram    Synthia Innocenteborah Gibson Lad NP [facility]  Contact [phone] Monday through Friday 8am- 5pm  After hours call [phone]

## 2015-09-01 LAB — LIPID PANEL
CHOLESTEROL: 195 mg/dL (ref 0–200)
HDL: 59 mg/dL (ref 35–70)
LDL Cholesterol: 110 mg/dL
TRIGLYCERIDES: 130 mg/dL (ref 40–160)

## 2015-09-01 LAB — HEPATIC FUNCTION PANEL
ALK PHOS: 82 U/L (ref 25–125)
ALT: 8 U/L (ref 7–35)
AST: 19 U/L (ref 13–35)
BILIRUBIN, TOTAL: 0.2 mg/dL

## 2015-09-01 LAB — CBC AND DIFFERENTIAL
HCT: 36 % (ref 36–46)
Hemoglobin: 10.6 g/dL — AB (ref 12.0–16.0)
PLATELETS: 215 10*3/uL (ref 150–399)
WBC: 5.5 10^3/mL

## 2015-09-01 LAB — BASIC METABOLIC PANEL
BUN: 21 mg/dL (ref 4–21)
CREATININE: 0.9 mg/dL (ref <=1.1)
Glucose: 143 mg/dL
Potassium: 4.7 mmol/L (ref 3.4–5.3)
SODIUM: 141 mmol/L (ref 137–147)

## 2015-09-01 LAB — HEMOGLOBIN A1C: HEMOGLOBIN A1C: 6.4

## 2015-09-02 ENCOUNTER — Other Ambulatory Visit: Payer: Self-pay | Admitting: *Deleted

## 2015-09-16 ENCOUNTER — Non-Acute Institutional Stay (SKILLED_NURSING_FACILITY): Payer: Medicare Other | Admitting: Adult Health

## 2015-09-16 DIAGNOSIS — Z794 Long term (current) use of insulin: Secondary | ICD-10-CM | POA: Diagnosis not present

## 2015-09-16 DIAGNOSIS — E1140 Type 2 diabetes mellitus with diabetic neuropathy, unspecified: Secondary | ICD-10-CM

## 2015-09-16 DIAGNOSIS — E785 Hyperlipidemia, unspecified: Secondary | ICD-10-CM | POA: Diagnosis not present

## 2015-09-16 DIAGNOSIS — E1169 Type 2 diabetes mellitus with other specified complication: Secondary | ICD-10-CM

## 2015-09-28 ENCOUNTER — Encounter: Payer: Self-pay | Admitting: Adult Health

## 2015-09-28 NOTE — Progress Notes (Signed)
Location:   [facility]   Place of Service:  SNF (31)   CODE STATUS: full code   No Known Allergies  Chief Complaint  Patient presents with  . Acute Visit    diabetes    HPI:  I have been asked to review her diabetes. Her cbg's have been well managed; with readings mostly under 170. There are  No reports of hypoglycemic episodes. She is unable to fully participate in the hpi or ros.   Past Medical History  Diagnosis Date  . Diabetes mellitus   . Herpes simplex   . Hyperlipidemia   . Urinary tract infection   . Altered mental status   . Esophageal reflux   . Congenital coronary artery anomaly   . Cerebrovascular disease   . Depression   . Constipation   . Dementia   . Generalized abdominal pain     No past surgical history on file.  Social History   Social History  . Marital Status: Widowed    Spouse Name: N/A  . Number of Children: N/A  . Years of Education: N/A   Occupational History  . Not on file.   Social History Main Topics  . Smoking status: Former Games developermoker  . Smokeless tobacco: Not on file  . Alcohol Use: No  . Drug Use: No  . Sexual Activity: Not on file   Other Topics Concern  . Not on file   Social History Narrative   No family history on file.    VITAL SIGNS BP 120/56 mmHg  Pulse 73  Ht 5\' 4"  (1.626 m)  Wt 173 lb (78.472 kg)  BMI 29.68 kg/m2  SpO2 98%  Patient's Medications  New Prescriptions   No medications on file  Previous Medications   ASPIRIN 325 MG EC TABLET    Take 325 mg by mouth daily.   CALCIUM CARBONATE (TUMS - DOSED IN MG ELEMENTAL CALCIUM) 500 MG CHEWABLE TABLET    Chew 2 tablets by mouth 2 (two) times daily.    CHOLECALCIFEROL (VITAMIN D) 1000 UNITS TABLET    Take 1,000 Units by mouth daily.    DONEPEZIL (ARICEPT) 5 MG TABLET    Take 5 mg by mouth at bedtime.   INSULIN ASPART (NOVOLOG) 100 UNIT/ML INJECTION    Inject 5 Units into the skin 3 (three) times daily before meals. Only takes 5 units if cbg >150   INSULIN GLARGINE (LANTUS) 100 UNIT/ML INJECTION    Inject 0.2 mLs (20 Units total) into the skin at bedtime.   LISINOPRIL (PRINIVIL,ZESTRIL) 10 MG TABLET    Take 1 tablet (10 mg total) by mouth daily.   MEMANTINE HCL ER (NAMENDA XR) 21 MG CP24    Take 21 mg by mouth daily.   POLYETHYL GLYCOL-PROPYL GLYCOL (SYSTANE) 0.4-0.3 % SOLN    Apply 1 drop to eye 3 (three) times daily.   POLYETHYLENE GLYCOL POWDER (GLYCOLAX/MIRALAX) POWDER    Take 17 g by mouth daily.   RANITIDINE (ZANTAC) 75 MG TABLET    Take 150 mg by mouth at bedtime.   SERTRALINE (ZOLOFT) 50 MG TABLET    Take 50 mg by mouth daily.  Modified Medications   No medications on file  Discontinued Medications   No medications on file     SIGNIFICANT DIAGNOSTIC EXAMS   07-13-14: dexa: t score -2.5  08-05-14: mammogram: No mammographic evidence of malignancy. A result letter of this screening mammogram will be mailed directly to the patient.  10-29-14: chest x-ray: 1.  No acute findings. 2. Atherosclerotic aortic arch.    LABS REVIEWED:   10-07-14: wbc 4.4; hgb 9.9; hct 33.4; mcv 90.2 plt 149; glucose 67; bun 24; creat 0.94; k+ 4.5; na++142; liver normal albumin 3.3 10-09-14: urine for micro-albumin 1.2 (2015: 2.5 ) 10-29-14: hgb 12.9; hct 38.0  glucose 164; bun 26; creat 1.20; k+4.9; na++143;  12-16-14: hgb a1c 7.5  02-25-15: wbc 5.1; hgb 9.8; hct 31.5; mcv 87.9; plt 164; glucose 73; bun 32.7; creat 1.00; k+ 4.5; na++143; ;liver normal albumin 3.2; tsh 0.47; vit B12: 490; folic acid 7.8; free T4: 0.97 03-25-15: chol 208; ldl 125; trig 144; hdl 55  07-01-15: hgb a1c 6.8  09-01-15: wbc 5.5; hgb 10.6; hct 35.9; mcv 87.8; plt 215; glucose 143; bun 20.6; creat 0.87; k+ 4.7; na++ 141; liver normal albumin 3.5; hgb a1c 6.4; chol 195; ldl 110; trig 130; hdl 59    .   Review of Systems Unable to perform ROS: Dementia    Physical Exam Constitutional: No distress.  Eyes: Conjunctivae are normal.  Neck: Neck supple. No JVD present. No  thyromegaly present.  Cardiovascular: Normal rate, regular rhythm and intact distal pulses.   Respiratory: Effort normal and breath sounds normal. No respiratory distress. She has no wheezes.  GI: Soft. Bowel sounds are normal. She exhibits no distension. There is no tenderness.  Musculoskeletal: She exhibits no edema.  Able to move all extremities   Lymphadenopathy:    She has no cervical adenopathy.  Neurological: She is alert.  Skin: Skin is warm and dry. She is not diaphoretic.  Psychiatric: She has a normal mood and affect.       ASSESSMENT/ PLAN:  1.  Dyslipidemia: her ldl is 110; is off lipitor due to her weight and appetite changes.   2. Diabetes: her hgb a1c is 6.4; will continue lantus 20 units daily will stop the novolog at this time and will check her cbg's twice daily will monitor her status.     Synthia Innocent NP [facility]  Contact [phone] Monday through Friday 8am- 5pm  After hours call [phone]

## 2015-10-04 ENCOUNTER — Encounter: Payer: Self-pay | Admitting: Internal Medicine

## 2015-10-04 ENCOUNTER — Non-Acute Institutional Stay (SKILLED_NURSING_FACILITY): Payer: Medicare Other | Admitting: Internal Medicine

## 2015-10-04 DIAGNOSIS — R634 Abnormal weight loss: Secondary | ICD-10-CM | POA: Diagnosis not present

## 2015-10-04 DIAGNOSIS — I6930 Unspecified sequelae of cerebral infarction: Secondary | ICD-10-CM | POA: Diagnosis not present

## 2015-10-04 DIAGNOSIS — K219 Gastro-esophageal reflux disease without esophagitis: Secondary | ICD-10-CM | POA: Diagnosis not present

## 2015-10-04 NOTE — Progress Notes (Signed)
MRN: [id number] Name: [name] [name]  Sex: female Age: 75 y.o. DOB: [birth year]  PSC #:  Facility/Room: [facility] / 222 A Level Of Care: SNF Provider: Randon Goldsmith. Lyn Hollingshead, MD Emergency Contacts: Extended Emergency Contact Information Primary Emergency Contact: Wynn,Blondine Address: [address]          [place] Home Phone: [phone] Work Phone: [phone] Relation: Sister  Code Status: Full Code  Allergies: Review of patient's allergies indicates no known allergies.  Chief Complaint  Patient presents with  . Medical Management of Chronic Issues    Routine Visit    HPI: Patient is 75 y.o. female who is being seen for routine issues of weight loss, GERD and h/o stroke with residual deficits.  Past Medical History:  Diagnosis Date  . Altered mental status   . Cerebrovascular disease   . Congenital coronary artery anomaly   . Constipation   . Dementia   . Depression   . Diabetes mellitus   . Esophageal reflux   . Generalized abdominal pain   . Herpes simplex   . Hyperlipidemia   . Urinary tract infection     No past surgical history on file.    Medication List       Accurate as of 10/04/15 11:26 AM. Always use your most recent med list.          aspirin 325 MG EC tablet Take 325 mg by mouth daily.   calcium carbonate 500 MG chewable tablet Commonly known as:  TUMS - dosed in mg elemental calcium Chew 2 tablets by mouth 2 (two) times daily.   cholecalciferol 1000 units tablet Commonly known as:  VITAMIN D Take 1,000 Units by mouth daily.   donepezil 5 MG tablet Commonly known as:  ARICEPT Take 5 mg by mouth at bedtime.   insulin aspart 100 UNIT/ML injection Commonly known as:  novoLOG Inject 5 Units into the skin 3 (three) times daily before meals. Only takes 5 units if cbg >150   insulin glargine 100 UNIT/ML injection Commonly known as:  LANTUS Inject 0.2 mLs (20 Units total) into the skin at bedtime.    lisinopril 10 MG tablet Commonly known as:  PRINIVIL,ZESTRIL Take 10 mg by mouth daily. Notify MD for blood sugar less than 60 and greater than 450   Memantine HCl ER 21 MG Cp24 Commonly known as:  NAMENDA XR Take 21 mg by mouth daily.   polyethylene glycol powder powder Commonly known as:  GLYCOLAX/MIRALAX Take 17 g by mouth daily.   ranitidine 75 MG tablet Commonly known as:  ZANTAC Take 150 mg by mouth at bedtime.   sertraline 50 MG tablet Commonly known as:  ZOLOFT Take 50 mg by mouth daily.   SYSTANE 0.4-0.3 % Soln Generic drug:  Polyethyl Glycol-Propyl Glycol Apply 1 drop to eye 3 (three) times daily.       Meds ordered this encounter  Medications  . lisinopril (PRINIVIL,ZESTRIL) 10 MG tablet    Sig: Take 10 mg by mouth daily. Notify MD for blood sugar less than 60 and greater than 450    Immunization History  Administered Date(s) Administered  . Influenza Whole 12/23/2012  . Influenza-Unspecified 04/20/2015  . Pneumococcal Polysaccharide-23 02/18/2009    Social History  Substance Use Topics  . Smoking status: Former Games developer  . Smokeless tobacco: Not on file  . Alcohol use No    Review of  Systems  UTO 2/2 dementia     Vitals:  10/04/15 1116  BP: (!) 163/77  Pulse: 64  Resp: 16  Temp: 97.6 F (36.4 C)    Physical Exam  GENERAL APPEARANCE: Alert, No acute distress  SKIN: No diaphoresis rash HEENT: Unremarkable RESPIRATORY: Breathing is even, unlabored. Lung sounds are clear   CARDIOVASCULAR: Heart RRR no murmurs, rubs or gallops. No peripheral edema  GASTROINTESTINAL: Abdomen is soft, non-tender, not distended w/ normal bowel sounds.  GENITOURINARY: Bladder non tender, not distended  MUSCULOSKELETAL: No abnormal joints or musculature NEUROLOGIC: Cranial nerves 2-12 grossly intact. Moves all extremities PSYCHIATRIC: Mood and affect appropriate to situation, no behavioral issues  Patient Active Problem List   Diagnosis Date Noted  .  Dyslipidemia associated with type 2 diabetes mellitus (HCC) 08/31/2015  . Weight loss 05/23/2015  . DM type 2, uncontrolled, with neuropathy (HCC) 12/17/2014  . Anemia in other chronic diseases classified elsewhere 12/03/2014  . Osteoporosis 11/10/2014  . Dyslipidemia 06/17/2014  . History of stroke with residual deficit 04/04/2014  . Constipation due to slow transit 02/25/2014  . Anemia 04/22/2013  . Vascular dementia without behavioral disturbance 03/12/2013  . 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 Essential hypertension, benign 07/02/2012  . Unspecified vitamin D deficiency 07/02/2012  . Depression 07/02/2012  . GERD (gastroesophageal reflux disease) 07/02/2012    CBC    Component Value Date/Time   WBC 5.5 09/01/2015   WBC 5.0 08/27/2011 1803   RBC 3.84 (L) 08/27/2011 1803   HGB 10.6 (A) 09/01/2015   HCT 36 09/01/2015   PLT 215 09/01/2015   MCV 88.3 08/27/2011 1803   LYMPHSABS 1.8 08/27/2011 1803   MONOABS 0.4 08/27/2011 1803   EOSABS 0.1 08/27/2011 1803   BASOSABS 0.0 08/27/2011 1803    CMP     Component Value Date/Time   NA 141 09/01/2015   K 4.7 09/01/2015   CL 108 10/29/2014 1327   CO2 28 08/27/2011 1803   GLUCOSE 164 (H) 10/29/2014 1327   BUN 21 09/01/2015   CREATININE 0.9 09/01/2015   CREATININE 1.20 (H) 10/29/2014 1327   CALCIUM 9.4 08/27/2011 1803   PROT 7.3 01/17/2010 1318   ALBUMIN 3.7 01/17/2010 1318   AST 19 09/01/2015   ALT 8 09/01/2015   ALKPHOS 82 09/01/2015   BILITOT 0.5 01/17/2010 1318   GFRNONAA 85 (L) 08/27/2011 1803   GFRAA >90 08/27/2011 1803    Assessment and Plan  Weight loss Pt's fosamax and lipitor were d/c 2/2 to weight loss; pt has had a 5+ pound increase in weight;cont monitor  GERD (gastroesophageal reflux disease) Controled on zantac 150 mg q HS and TUMS BID; cont current meds  History of stroke with residual deficit she is neurologically stable will continue her asa  325 mg daily and will monitor   No problem-specific Assessment & Plan notes found for this encounter.   Randon Goldsmith. Lyn Hollingshead, MD

## 2015-10-18 ENCOUNTER — Encounter: Payer: Self-pay | Admitting: Internal Medicine

## 2015-10-18 LAB — CBC AND DIFFERENTIAL
HEMATOCRIT: 36 % (ref 36–46)
HEMOGLOBIN: 10.9 g/dL — AB (ref 12.0–16.0)
PLATELETS: 133 10*3/uL — AB (ref 150–399)
WBC: 5 10^3/mL

## 2015-10-18 NOTE — Progress Notes (Signed)
This encounter was created in error - please disregard.

## 2015-11-11 ENCOUNTER — Non-Acute Institutional Stay (SKILLED_NURSING_FACILITY): Payer: Medicare Other | Admitting: Internal Medicine

## 2015-11-11 ENCOUNTER — Encounter: Payer: Self-pay | Admitting: Internal Medicine

## 2015-11-11 DIAGNOSIS — I6930 Unspecified sequelae of cerebral infarction: Secondary | ICD-10-CM

## 2015-11-11 DIAGNOSIS — Z794 Long term (current) use of insulin: Secondary | ICD-10-CM

## 2015-11-11 DIAGNOSIS — E785 Hyperlipidemia, unspecified: Secondary | ICD-10-CM | POA: Diagnosis not present

## 2015-11-11 DIAGNOSIS — E1140 Type 2 diabetes mellitus with diabetic neuropathy, unspecified: Secondary | ICD-10-CM

## 2015-11-11 DIAGNOSIS — F32A Depression, unspecified: Secondary | ICD-10-CM

## 2015-11-11 DIAGNOSIS — E1169 Type 2 diabetes mellitus with other specified complication: Secondary | ICD-10-CM

## 2015-11-11 DIAGNOSIS — F329 Major depressive disorder, single episode, unspecified: Secondary | ICD-10-CM

## 2015-11-11 DIAGNOSIS — D638 Anemia in other chronic diseases classified elsewhere: Secondary | ICD-10-CM

## 2015-11-11 DIAGNOSIS — F0150 Vascular dementia without behavioral disturbance: Secondary | ICD-10-CM | POA: Diagnosis not present

## 2015-11-11 DIAGNOSIS — I10 Essential (primary) hypertension: Secondary | ICD-10-CM | POA: Diagnosis not present

## 2015-11-11 NOTE — Progress Notes (Signed)
Patient ID: [name] [name], female   DOB: [birth year], 75 y.o.   MRN: [id number]    DATE:  11/11/15  Location:    [facility] Room Number: 222 A Place of Service: SNF (31)   Extended Emergency Contact Information Primary Emergency Contact: Wynn,Blondine Address: [address]          [place] Home Phone: [phone] Work Phone: [phone] Relation: Sister  Advanced Directive information FULL CODE  Chief Complaint  Patient presents with  . Medical Management of Chronic Issues    Routine Visit    HPI:  75 yo female long term resident seen today for f/u. She c/o fatigue. No nursing issues. No falls. She sleeps well. Appetite ok but she has lost 1 lb since last month. She is a poor historian due to dementia. Hx obtained from chart.  Hyperlipidemia - LDL 110. Off lipitor due to her weight and appetite changes.   DM -  Controlled with A1c 6.4%. Takes  lantus 20 units daily. Off novolog. She gets humalog if CBG >150. CBGs checked BID. Urine microalbumin is 1.2  Hypertension - stable on lisinopril 10 mg daily  Vascular dementia - unchanged. Takes aricept 5 mg nightly and namenda ER 21 mg daily. She lost 1 lb in the last month  Depression - mood stable on zoloft 75 mg daily   Osteoporosis- stable on vit d and calcium.  T score is -2.5. Off fosamax due to lowered appetite and weight loss   Hx CVA - stable on ASA daily  GERD - stable on zantac  Anemia - Hgb stable at 10.9  Past Medical History:  Diagnosis Date  . Altered mental status   . Cerebrovascular disease   . Congenital coronary artery anomaly   . Constipation   . Dementia   . Depression   . Diabetes mellitus   . Esophageal reflux   . Generalized abdominal pain   . Herpes simplex   . Hyperlipidemia   . Urinary tract infection     History reviewed. No pertinent surgical history.  Patient Care Team: Kirt Boys, DO as PCP - General (Internal Medicine) Sharee Holster, NP as Nurse Practitioner (Geriatric Medicine) [facility] (Sk[facility] Nursing Facility)  Social History   Social History  . Marital status: Widowed    Spouse name: N/A  . Number of children: N/A  . Years of education: N/A   Occupational History  . Not on file.   Social History Main Topics  . Smoking status: Former Games developer  . Smokeless tobacco: Never Used  . Alcohol use No  . Drug use: No  . Sexual activity: Not on file   Other Topics Concern  . Not on file   Social History Narrative  . No narrative on file     reports that she has quit smoking. She has never used smokeless tobacco. She reports that she does not drink alcohol or use drugs.  History reviewed. No pertinent family history. No family status information on file.    Immunization History  Administered Date(s) Administered  . Influenza Whole 12/23/2012  . Influenza-Unspecified 04/20/2015  . Pneumococcal Polysaccharide-23 02/18/2009    No Known Allergies  Medications: Patient's Medications  New Prescriptions   No medications on file  Previous Medications   CALCIUM CARBONATE (TUMS - DOSED IN MG ELEMENTAL CALCIUM) 500 MG CHEWABLE TABLET    Chew 2 tablets by mouth 2 (two) times daily.    CHOLECALCIFEROL (VITAMIN  D) 1000 UNITS TABLET    Take 1,000 Units by mouth daily.    DONEPEZIL (ARICEPT) 5 MG TABLET    Take 5 mg by mouth at bedtime.   INSULIN GLARGINE (LANTUS) 100 UNIT/ML INJECTION    Inject 0.2 mLs (20 Units total) into the skin at bedtime.   INSULIN LISPRO (HUMALOG) 100 UNIT/ML INJECTION    Inject as per sliding scale: if 0-150 =0 units, call MD if CBG < 60 ; 151-450 =5 call MD if CBG > 450   LISINOPRIL (PRINIVIL,ZESTRIL) 10 MG TABLET    Take 10 mg by mouth daily. Notify MD for blood sugar less than 60 and greater than 450   MEMANTINE HCL ER (NAMENDA XR) 21 MG CP24    Take 21 mg by mouth daily.   POLYETHYL GLYCOL-PROPYL GLYCOL (SYSTANE) 0.4-0.3 % SOLN    Apply 1 drop to eye 3 (three)  times daily.   POLYETHYLENE GLYCOL POWDER (GLYCOLAX/MIRALAX) POWDER    Take 17 g by mouth daily.   RANITIDINE (ZANTAC) 150 MG CAPSULE    Take 150 mg by mouth at bedtime.   SERTRALINE (ZOLOFT) 50 MG TABLET    Take 50 mg by mouth at bedtime.   Modified Medications   No medications on file  Discontinued Medications   ASPIRIN 325 MG EC TABLET    Take 325 mg by mouth daily.   INSULIN ASPART (NOVOLOG) 100 UNIT/ML INJECTION    Inject 5 Units into the skin 3 (three) times daily before meals. Only takes 5 units if cbg >150    Review of Systems  Unable to perform ROS: Dementia    Vitals:   11/11/15 1225  BP: 120/66  Pulse: 69  Resp: 16  Temp: (!) 96.3 F (35.7 C)  TempSrc: Oral  SpO2: 98%  Weight: 166 lb (75.3 kg)  Height: 5\' 4"  (1.626 m)   Body mass index is 28.49 kg/m.  Physical Exam  Constitutional: She appears well-developed.  Sitting up in bed in NAD, frail appearing  HENT:  Mouth/Throat: Oropharynx is clear and moist. No oropharyngeal exudate.  MMM  Eyes: Pupils are equal, round, and reactive to light. No scleral icterus.  Neck: Neck supple. Carotid bruit is not present. No tracheal deviation present.  Cardiovascular: Normal rate, regular rhythm and intact distal pulses.  Exam reveals no gallop and no friction rub.   Murmur (1/6 SEM) heard. Trace BLE edema. No calf TTP  Pulmonary/Chest: Effort normal and breath sounds normal. No stridor. No respiratory distress. She has no wheezes. She has no rales.  Poor inspiratory effort  Abdominal: Soft. Bowel sounds are normal. She exhibits no distension and no mass. There is no hepatomegaly. There is no tenderness. There is no rebound and no guarding.  Musculoskeletal: She exhibits edema.  Lymphadenopathy:    She has no cervical adenopathy.  Neurological: She is alert.  Expressive aphasia  Skin: Skin is warm and dry. No rash noted.  Psychiatric: She has a normal mood and affect. Her behavior is normal. Her speech is slurred.      Labs reviewed: Nursing Home on 11/11/2015  Component Date Value Ref Range Status  . Hemoglobin 10/18/2015 10.9* 12.0 - 16.0 g/dL Final  . HCT 09/81/1914 36  36 - 46 % Final  . Platelets 10/18/2015 133* 150 - 399 K/L Final  . WBC 10/18/2015 5.0  10^3/mL Final  Lab on 09/02/2015  Component Date Value Ref Range Status  . Hemoglobin 09/01/2015 10.6* 12.0 - 16.0 g/dL Final  .  HCT 09/01/2015 36  36 - 46 % Final  . Platelets 09/01/2015 215  150 - 399 K/L Final  . WBC 09/01/2015 5.5  10^3/mL Final  . Glucose 09/01/2015 143  mg/dL Final  . BUN 16/10/960406/21/2017 21  4 - 21 mg/dL Final  . Creatinine 54/09/811906/21/2017 0.9  0.5 - 1.1 mg/dL Final  . Potassium 14/78/295606/21/2017 4.7  3.4 - 5.3 mmol/L Final  . Sodium 09/01/2015 141  137 - 147 mmol/L Final  . Triglycerides 09/01/2015 130  40 - 160 mg/dL Final  . Cholesterol 21/30/865706/21/2017 195  0 - 200 mg/dL Final  . HDL 84/69/629506/21/2017 59  35 - 70 mg/dL Final  . LDL Cholesterol 09/01/2015 110  mg/dL Final  . Alkaline Phosphatase 09/01/2015 82  25 - 125 U/L Final  . ALT 09/01/2015 8  7 - 35 U/L Final  . AST 09/01/2015 19  13 - 35 U/L Final  . Bilirubin, Total 09/01/2015 0.2  mg/dL Final  . Hemoglobin [id number] 09/01/2015 6.4   Final    No results found.   Assessment/Plan   ICD-9-CM ICD-10-CM   1. Controlled type 2 diabetes mellitus with diabetic neuropathy, with long-term current use of insulin (HCC) 250.60 E11.40    357.2 Z79.4    V58.67    2. Vascular dementia without behavioral disturbance 290.40 F01.50   3. Essential hypertension, benign 401.1 I10   4. Anemia in other chronic diseases classified elsewhere 285.29 D63.8   5. Dyslipidemia associated with type 2 diabetes mellitus (HCC) 250.80 E11.69    272.4 E78.5   6. History of stroke with residual deficit 438.9 I69.30   7. Depression 311 F32.9     Cont current meds as ordered, including ASA  PT/OT/ST as indicated  No labs needed this month but she will need CMP, A1c, lipid panel next month  She is a long  term resident. Will follow  Cassara Nida S. Ancil Linseyarter, D. O., F. A. C. O. I.  [facility] [address] [place], [place] [address] [phone] Cell (Monday-Friday 8 AM - 5 PM) [phone] After 5 PM and follow prompts

## 2015-11-16 ENCOUNTER — Inpatient Hospital Stay (HOSPITAL_COMMUNITY): Payer: Medicare Other

## 2015-11-16 ENCOUNTER — Non-Acute Institutional Stay (SKILLED_NURSING_FACILITY): Payer: Medicare Other | Admitting: Adult Health

## 2015-11-16 ENCOUNTER — Emergency Department (HOSPITAL_COMMUNITY): Payer: Medicare Other

## 2015-11-16 ENCOUNTER — Inpatient Hospital Stay (HOSPITAL_COMMUNITY)
Admission: EM | Admit: 2015-11-16 | Discharge: 2015-12-12 | DRG: 871 | Disposition: E | Payer: Medicare Other | Attending: Pulmonary Disease | Admitting: Pulmonary Disease

## 2015-11-16 DIAGNOSIS — R112 Nausea with vomiting, unspecified: Secondary | ICD-10-CM

## 2015-11-16 DIAGNOSIS — G9340 Encephalopathy, unspecified: Secondary | ICD-10-CM

## 2015-11-16 DIAGNOSIS — T17928A Food in respiratory tract, part unspecified causing other injury, initial encounter: Secondary | ICD-10-CM

## 2015-11-16 DIAGNOSIS — R401 Stupor: Secondary | ICD-10-CM

## 2015-11-16 DIAGNOSIS — I6930 Unspecified sequelae of cerebral infarction: Secondary | ICD-10-CM | POA: Diagnosis not present

## 2015-11-16 DIAGNOSIS — Z515 Encounter for palliative care: Secondary | ICD-10-CM | POA: Diagnosis not present

## 2015-11-16 DIAGNOSIS — R6521 Severe sepsis with septic shock: Secondary | ICD-10-CM

## 2015-11-16 DIAGNOSIS — E1140 Type 2 diabetes mellitus with diabetic neuropathy, unspecified: Secondary | ICD-10-CM | POA: Diagnosis present

## 2015-11-16 DIAGNOSIS — Z8673 Personal history of transient ischemic attack (TIA), and cerebral infarction without residual deficits: Secondary | ICD-10-CM | POA: Diagnosis not present

## 2015-11-16 DIAGNOSIS — K219 Gastro-esophageal reflux disease without esophagitis: Secondary | ICD-10-CM | POA: Diagnosis present

## 2015-11-16 DIAGNOSIS — T17890A Other foreign object in other parts of respiratory tract causing asphyxiation, initial encounter: Secondary | ICD-10-CM | POA: Diagnosis not present

## 2015-11-16 DIAGNOSIS — R579 Shock, unspecified: Secondary | ICD-10-CM | POA: Diagnosis present

## 2015-11-16 DIAGNOSIS — I10 Essential (primary) hypertension: Secondary | ICD-10-CM | POA: Diagnosis present

## 2015-11-16 DIAGNOSIS — D649 Anemia, unspecified: Secondary | ICD-10-CM | POA: Diagnosis present

## 2015-11-16 DIAGNOSIS — Z87891 Personal history of nicotine dependence: Secondary | ICD-10-CM

## 2015-11-16 DIAGNOSIS — F0150 Vascular dementia without behavioral disturbance: Secondary | ICD-10-CM | POA: Diagnosis present

## 2015-11-16 DIAGNOSIS — E860 Dehydration: Secondary | ICD-10-CM | POA: Diagnosis present

## 2015-11-16 DIAGNOSIS — J80 Acute respiratory distress syndrome: Secondary | ICD-10-CM | POA: Diagnosis present

## 2015-11-16 DIAGNOSIS — J690 Pneumonitis due to inhalation of food and vomit: Secondary | ICD-10-CM

## 2015-11-16 DIAGNOSIS — E785 Hyperlipidemia, unspecified: Secondary | ICD-10-CM | POA: Diagnosis present

## 2015-11-16 DIAGNOSIS — Z794 Long term (current) use of insulin: Secondary | ICD-10-CM

## 2015-11-16 DIAGNOSIS — R1110 Vomiting, unspecified: Secondary | ICD-10-CM

## 2015-11-16 DIAGNOSIS — J9600 Acute respiratory failure, unspecified whether with hypoxia or hypercapnia: Secondary | ICD-10-CM | POA: Diagnosis not present

## 2015-11-16 DIAGNOSIS — G9341 Metabolic encephalopathy: Secondary | ICD-10-CM | POA: Diagnosis present

## 2015-11-16 DIAGNOSIS — N170 Acute kidney failure with tubular necrosis: Secondary | ICD-10-CM | POA: Diagnosis present

## 2015-11-16 DIAGNOSIS — J9601 Acute respiratory failure with hypoxia: Secondary | ICD-10-CM

## 2015-11-16 DIAGNOSIS — A419 Sepsis, unspecified organism: Secondary | ICD-10-CM | POA: Diagnosis present

## 2015-11-16 DIAGNOSIS — E861 Hypovolemia: Secondary | ICD-10-CM | POA: Diagnosis present

## 2015-11-16 DIAGNOSIS — T17920A Food in respiratory tract, part unspecified causing asphyxiation, initial encounter: Secondary | ICD-10-CM

## 2015-11-16 DIAGNOSIS — E874 Mixed disorder of acid-base balance: Secondary | ICD-10-CM | POA: Diagnosis present

## 2015-11-16 DIAGNOSIS — Z66 Do not resuscitate: Secondary | ICD-10-CM | POA: Diagnosis not present

## 2015-11-16 DIAGNOSIS — F329 Major depressive disorder, single episode, unspecified: Secondary | ICD-10-CM | POA: Diagnosis present

## 2015-11-16 DIAGNOSIS — Z452 Encounter for adjustment and management of vascular access device: Secondary | ICD-10-CM

## 2015-11-16 LAB — CK: CK TOTAL: 71 U/L (ref 38–234)

## 2015-11-16 LAB — MRSA PCR SCREENING: MRSA by PCR: NEGATIVE

## 2015-11-16 LAB — URINE MICROSCOPIC-ADD ON

## 2015-11-16 LAB — URINALYSIS, ROUTINE W REFLEX MICROSCOPIC
GLUCOSE, UA: NEGATIVE mg/dL
Ketones, ur: NEGATIVE mg/dL
Nitrite: NEGATIVE
Protein, ur: 100 mg/dL — AB
SPECIFIC GRAVITY, URINE: 1.025 (ref 1.005–1.030)
pH: 5 (ref 5.0–8.0)

## 2015-11-16 LAB — I-STAT ARTERIAL BLOOD GAS, ED
Acid-base deficit: 9 mmol/L — ABNORMAL HIGH (ref 0.0–2.0)
Bicarbonate: 18.7 mmol/L — ABNORMAL LOW (ref 20.0–28.0)
O2 Saturation: 100 %
PH ART: 7.222 — AB (ref 7.350–7.450)
TCO2: 20 mmol/L (ref 0–100)
pCO2 arterial: 45.4 mmHg (ref 32.0–48.0)
pO2, Arterial: 289 mmHg — ABNORMAL HIGH (ref 83.0–108.0)

## 2015-11-16 LAB — COMPREHENSIVE METABOLIC PANEL
ALK PHOS: 55 U/L (ref 38–126)
ALT: 13 U/L — ABNORMAL LOW (ref 14–54)
ANION GAP: 7 (ref 5–15)
AST: 25 U/L (ref 15–41)
Albumin: 2.6 g/dL — ABNORMAL LOW (ref 3.5–5.0)
BILIRUBIN TOTAL: 0.4 mg/dL (ref 0.3–1.2)
BUN: 29 mg/dL — ABNORMAL HIGH (ref 6–20)
CALCIUM: 7.6 mg/dL — AB (ref 8.9–10.3)
CO2: 18 mmol/L — ABNORMAL LOW (ref 22–32)
CREATININE: 1.58 mg/dL — AB (ref 0.44–1.00)
Chloride: 117 mmol/L — ABNORMAL HIGH (ref 101–111)
GFR, EST AFRICAN AMERICAN: 36 mL/min — AB (ref >60)
GFR, EST NON AFRICAN AMERICAN: 31 mL/min — AB (ref >60)
Glucose, Bld: 196 mg/dL — ABNORMAL HIGH (ref 65–99)
Potassium: 3.8 mmol/L (ref 3.5–5.1)
Sodium: 142 mmol/L (ref 135–145)
TOTAL PROTEIN: 5.3 g/dL — AB (ref 6.5–8.1)

## 2015-11-16 LAB — LIPASE, BLOOD: Lipase: 45 U/L (ref 11–51)

## 2015-11-16 LAB — I-STAT TROPONIN, ED: Troponin i, poc: 0.01 ng/mL (ref 0.00–0.08)

## 2015-11-16 LAB — PROTIME-INR
INR: 1.43
PROTHROMBIN TIME: 17.5 s — AB (ref 11.4–15.2)

## 2015-11-16 LAB — CBC WITH DIFFERENTIAL/PLATELET
Basophils Absolute: 0 10*3/uL (ref 0.0–0.1)
Basophils Relative: 0 %
EOS ABS: 0.1 10*3/uL (ref 0.0–0.7)
Eosinophils Relative: 1 %
HEMATOCRIT: 32.7 % — AB (ref 36.0–46.0)
HEMOGLOBIN: 10.2 g/dL — AB (ref 12.0–15.0)
LYMPHS ABS: 2.3 10*3/uL (ref 0.7–4.0)
LYMPHS PCT: 21 %
MCH: 27.9 pg (ref 26.0–34.0)
MCHC: 31.2 g/dL (ref 30.0–36.0)
MCV: 89.3 fL (ref 78.0–100.0)
MONOS PCT: 2 %
Monocytes Absolute: 0.3 10*3/uL (ref 0.1–1.0)
NEUTROS ABS: 8.2 10*3/uL — AB (ref 1.7–7.7)
NEUTROS PCT: 76 %
Platelets: 151 10*3/uL (ref 150–400)
RBC: 3.66 MIL/uL — ABNORMAL LOW (ref 3.87–5.11)
RDW: 13.8 % (ref 11.5–15.5)
WBC: 10.8 10*3/uL — ABNORMAL HIGH (ref 4.0–10.5)

## 2015-11-16 LAB — GLUCOSE, CAPILLARY
GLUCOSE-CAPILLARY: 147 mg/dL — AB (ref 65–99)
Glucose-Capillary: 174 mg/dL — ABNORMAL HIGH (ref 65–99)

## 2015-11-16 LAB — AMMONIA: Ammonia: 42 umol/L — ABNORMAL HIGH (ref 9–35)

## 2015-11-16 LAB — RAPID URINE DRUG SCREEN, HOSP PERFORMED
Amphetamines: NOT DETECTED
BENZODIAZEPINES: POSITIVE — AB
Barbiturates: NOT DETECTED
COCAINE: NOT DETECTED
Opiates: NOT DETECTED
Tetrahydrocannabinol: NOT DETECTED

## 2015-11-16 LAB — BRAIN NATRIURETIC PEPTIDE: B Natriuretic Peptide: 21.6 pg/mL (ref 0.0–100.0)

## 2015-11-16 LAB — CORTISOL: Cortisol, Plasma: 20 ug/dL

## 2015-11-16 LAB — MAGNESIUM: MAGNESIUM: 1.8 mg/dL (ref 1.7–2.4)

## 2015-11-16 LAB — AMYLASE: AMYLASE: 442 U/L — AB (ref 28–100)

## 2015-11-16 LAB — APTT: aPTT: 30 seconds (ref 24–36)

## 2015-11-16 LAB — LACTIC ACID, PLASMA
LACTIC ACID, VENOUS: 4.6 mmol/L — AB (ref 0.5–1.9)
LACTIC ACID, VENOUS: 4.9 mmol/L — AB (ref 0.5–1.9)

## 2015-11-16 LAB — PROCALCITONIN: PROCALCITONIN: 4.04 ng/mL

## 2015-11-16 LAB — PHOSPHORUS: PHOSPHORUS: 3.7 mg/dL (ref 2.5–4.6)

## 2015-11-16 MED ORDER — FENTANYL CITRATE (PF) 100 MCG/2ML IJ SOLN
INTRAMUSCULAR | Status: AC
  Administered 2015-11-16: 50 ug via INTRAVENOUS
  Filled 2015-11-16: qty 2

## 2015-11-16 MED ORDER — MIDAZOLAM HCL 2 MG/2ML IJ SOLN
4.0000 mg | Freq: Once | INTRAMUSCULAR | Status: AC
  Administered 2015-11-16: 2 mg via INTRAVENOUS

## 2015-11-16 MED ORDER — PIPERACILLIN-TAZOBACTAM 3.375 G IVPB 30 MIN
3.3750 g | Freq: Once | INTRAVENOUS | Status: AC
  Administered 2015-11-16: 3.375 g via INTRAVENOUS
  Filled 2015-11-16: qty 50

## 2015-11-16 MED ORDER — FENTANYL CITRATE (PF) 100 MCG/2ML IJ SOLN
100.0000 ug | Freq: Once | INTRAMUSCULAR | Status: AC
  Administered 2015-11-16: 100 ug via INTRAVENOUS

## 2015-11-16 MED ORDER — VANCOMYCIN HCL IN DEXTROSE 1-5 GM/200ML-% IV SOLN
1000.0000 mg | INTRAVENOUS | Status: DC
  Filled 2015-11-16: qty 200

## 2015-11-16 MED ORDER — MIDAZOLAM HCL 2 MG/2ML IJ SOLN
INTRAMUSCULAR | Status: AC
  Administered 2015-11-16: 1 mg
  Filled 2015-11-16: qty 2

## 2015-11-16 MED ORDER — FENTANYL CITRATE (PF) 100 MCG/2ML IJ SOLN
INTRAMUSCULAR | Status: AC
  Administered 2015-11-16: 50 ug
  Filled 2015-11-16: qty 2

## 2015-11-16 MED ORDER — IOPAMIDOL (ISOVUE-300) INJECTION 61%
INTRAVENOUS | Status: AC
  Administered 2015-11-16: 75 mL
  Filled 2015-11-16: qty 75

## 2015-11-16 MED ORDER — SODIUM CHLORIDE 0.9 % IV SOLN
INTRAVENOUS | Status: DC
  Administered 2015-11-16: 20:00:00 via INTRAVENOUS

## 2015-11-16 MED ORDER — FENTANYL CITRATE (PF) 100 MCG/2ML IJ SOLN
INTRAMUSCULAR | Status: AC
  Filled 2015-11-16: qty 2

## 2015-11-16 MED ORDER — FAMOTIDINE IN NACL 20-0.9 MG/50ML-% IV SOLN
20.0000 mg | INTRAVENOUS | Status: DC
  Administered 2015-11-16 – 2015-11-17 (×2): 20 mg via INTRAVENOUS
  Filled 2015-11-16 (×3): qty 50

## 2015-11-16 MED ORDER — PIPERACILLIN-TAZOBACTAM 3.375 G IVPB
3.3750 g | Freq: Three times a day (TID) | INTRAVENOUS | Status: DC
  Administered 2015-11-17 – 2015-11-18 (×4): 3.375 g via INTRAVENOUS
  Filled 2015-11-16 (×6): qty 50

## 2015-11-16 MED ORDER — SUCCINYLCHOLINE CHLORIDE 20 MG/ML IJ SOLN
INTRAMUSCULAR | Status: DC | PRN
  Administered 2015-11-16: 20 mg via INTRAVENOUS

## 2015-11-16 MED ORDER — INSULIN ASPART 100 UNIT/ML ~~LOC~~ SOLN
2.0000 [IU] | SUBCUTANEOUS | Status: DC
  Administered 2015-11-16: 2 [IU] via SUBCUTANEOUS
  Administered 2015-11-16: 4 [IU] via SUBCUTANEOUS
  Administered 2015-11-17 (×2): 2 [IU] via SUBCUTANEOUS
  Administered 2015-11-17: 6 [IU] via SUBCUTANEOUS
  Administered 2015-11-17 – 2015-11-18 (×3): 4 [IU] via SUBCUTANEOUS
  Administered 2015-11-18: 2 [IU] via SUBCUTANEOUS
  Administered 2015-11-18: 6 [IU] via SUBCUTANEOUS

## 2015-11-16 MED ORDER — SODIUM CHLORIDE 0.9 % IV BOLUS (SEPSIS)
1000.0000 mL | Freq: Once | INTRAVENOUS | Status: AC
  Administered 2015-11-16: 1000 mL via INTRAVENOUS

## 2015-11-16 MED ORDER — FENTANYL CITRATE (PF) 100 MCG/2ML IJ SOLN
50.0000 ug | Freq: Once | INTRAMUSCULAR | Status: AC
  Administered 2015-11-16: 50 ug via INTRAVENOUS

## 2015-11-16 MED ORDER — MIDAZOLAM HCL 2 MG/2ML IJ SOLN
1.0000 mg | INTRAMUSCULAR | Status: DC | PRN
  Administered 2015-11-16: 4 mg via INTRAVENOUS

## 2015-11-16 MED ORDER — NOREPINEPHRINE BITARTRATE 1 MG/ML IV SOLN
0.0000 ug/min | INTRAVENOUS | Status: DC
  Administered 2015-11-16: 2 ug/min via INTRAVENOUS
  Administered 2015-11-17: 38 ug/min via INTRAVENOUS
  Administered 2015-11-17: 14 ug/min via INTRAVENOUS
  Filled 2015-11-16 (×4): qty 4

## 2015-11-16 MED ORDER — FAMOTIDINE IN NACL 20-0.9 MG/50ML-% IV SOLN: 20.0000 mg | Freq: Two times a day (BID) | INTRAVENOUS | Status: DC

## 2015-11-16 MED ORDER — ONDANSETRON HCL 4 MG/2ML IJ SOLN
4.0000 mg | Freq: Once | INTRAMUSCULAR | Status: AC
  Administered 2015-11-16: 4 mg via INTRAVENOUS

## 2015-11-16 MED ORDER — VANCOMYCIN HCL 10 G IV SOLR
1500.0000 mg | Freq: Once | INTRAVENOUS | Status: AC
  Administered 2015-11-16: 1500 mg via INTRAVENOUS
  Filled 2015-11-16: qty 1500

## 2015-11-16 MED ORDER — CHLORHEXIDINE GLUCONATE 0.12 % MT SOLN
15.0000 mL | Freq: Two times a day (BID) | OROMUCOSAL | Status: DC
  Administered 2015-11-16 – 2015-11-20 (×8): 15 mL via OROMUCOSAL
  Filled 2015-11-16 (×3): qty 15

## 2015-11-16 MED ORDER — ORAL CARE MOUTH RINSE
15.0000 mL | Freq: Four times a day (QID) | OROMUCOSAL | Status: DC
  Administered 2015-11-17 – 2015-11-20 (×9): 15 mL via OROMUCOSAL

## 2015-11-16 MED ORDER — HEPARIN SODIUM (PORCINE) 5000 UNIT/ML IJ SOLN: 5000.0000 [IU] | Freq: Three times a day (TID) | INTRAMUSCULAR | Status: DC

## 2015-11-16 MED ORDER — VANCOMYCIN HCL IN DEXTROSE 1-5 GM/200ML-% IV SOLN: 1000.0000 mg | INTRAVENOUS | Status: DC

## 2015-11-16 MED ORDER — SODIUM CHLORIDE 0.9 % IV SOLN: 250.0000 mL | INTRAVENOUS | Status: DC | PRN

## 2015-11-16 MED ORDER — MIDAZOLAM HCL 2 MG/2ML IJ SOLN
INTRAMUSCULAR | Status: AC
  Administered 2015-11-16: 2 mg via INTRAVENOUS
  Filled 2015-11-16: qty 4

## 2015-11-16 MED ORDER — MIDAZOLAM HCL 2 MG/2ML IJ SOLN
INTRAMUSCULAR | Status: AC
  Filled 2015-11-16: qty 4

## 2015-11-16 MED ORDER — NOREPINEPHRINE BITARTRATE 1 MG/ML IV SOLN: 0.0000 ug/min | Freq: Once | INTRAVENOUS | Status: DC

## 2015-11-16 MED ORDER — SODIUM CHLORIDE 0.9 % IV SOLN
25.0000 ug/h | INTRAVENOUS | Status: DC
  Administered 2015-11-16: 50 ug/h via INTRAVENOUS
  Filled 2015-11-16: qty 50

## 2015-11-16 MED ORDER — ONDANSETRON HCL 4 MG/2ML IJ SOLN
INTRAMUSCULAR | Status: AC
  Filled 2015-11-16: qty 2

## 2015-11-16 MED ORDER — ETOMIDATE 2 MG/ML IV SOLN
0.3000 mg/kg | Freq: Once | INTRAVENOUS | Status: AC
  Administered 2015-11-16: 22.6 mg via INTRAVENOUS

## 2015-11-16 NOTE — ED Triage Notes (Signed)
Pt brought in by EMS due to being unresponsive. Pt found by facility unresponsive. SP in the 70's per EMS.

## 2015-11-16 NOTE — ED Provider Notes (Signed)
[facility]-EMERGENCY DEPT Provider Note   CSN: [id number] Arrival date & time: 11/23/2015  1631     History   Chief Complaint Chief Complaint  Patient presents with  . Altered Mental Status    HPI Armella JONNA DITTRICH is a 75 y.o. female.  HPI  Patient is a 75 year old female with a complicated medical past medical history. Patient was brought in by EMS from [facility] where she had not been seen for a few days. Patient was found and was unresponsive. Patient brought to the ED where patient was vomiting and likely aspirated. Patient was intubated.  Past Medical History:  Diagnosis Date  . Altered mental status   . Cerebrovascular disease   . Congenital coronary artery anomaly   . Constipation   . Dementia   . Depression   . Diabetes mellitus   . Esophageal reflux   . Generalized abdominal pain   . Herpes simplex   . Hyperlipidemia   . Urinary tract infection     Patient Active Problem List   Diagnosis Date Noted  . Emesis   . Shock (HCC)   . Acute respiratory failure (HCC) Nov 23, 2015  . Dyslipidemia associated with type 2 diabetes mellitus (HCC) 08/31/2015  . Weight loss 05/23/2015  . DM type 2, uncontrolled, with neuropathy (HCC) 12/17/2014  . Anemia in other chronic diseases classified elsewhere 12/03/2014  . Osteoporosis 11/10/2014  . Dyslipidemia 06/17/2014  . History of stroke with residual deficit 04/04/2014  . Constipation due to slow transit 02/25/2014  . Anemia 04/22/2013  . Vascular dementia without behavioral disturbance 03/12/2013  . 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 Essential hypertension, benign 07/02/2012  . Unspecified vitamin D deficiency 07/02/2012  . Depression 07/02/2012  . GERD (gastroesophageal reflux disease) 07/02/2012    No past surgical history on file.  OB History    No data available       Home Medications    Prior to Admission medications   Medication Sig Start Date  End Date Taking? Authorizing Provider  calcium carbonate (TUMS - DOSED IN MG ELEMENTAL CALCIUM) 500 MG chewable tablet Chew 2 tablets by mouth 2 (two) times daily.    Yes Historical Provider, MD  cholecalciferol (VITAMIN D) 1000 UNITS tablet Take 1,000 Units by mouth daily.    Yes Historical Provider, MD  donepezil (ARICEPT) 5 MG tablet Take 5 mg by mouth at bedtime.   Yes Historical Provider, MD  insulin glargine (LANTUS) 100 UNIT/ML injection Inject 0.2 mLs (20 Units total) into the skin at bedtime. 12/31/13  Yes Tiffany L Reed, DO  insulin lispro (HUMALOG) 100 UNIT/ML injection Inject 0-5 Units into the skin See admin instructions. Inject as per sliding scale: if 0-150 =0 units, call MD if CBG < 60 ; 151-450 =5 call MD if CBG > 450    Yes Historical Provider, MD  lisinopril (PRINIVIL,ZESTRIL) 10 MG tablet Take 10 mg by mouth daily. Notify MD for blood sugar less than 60 and greater than 450   Yes Historical Provider, MD  Memantine HCl ER (NAMENDA XR) 21 MG CP24 Take 21 mg by mouth daily. 03/14/15  Yes Sharee Holster, NP  Polyethyl Glycol-Propyl Glycol (SYSTANE) 0.4-0.3 % SOLN Apply 1 drop to eye 3 (three) times daily.   Yes Historical Provider, MD  polyethylene glycol powder (GLYCOLAX/MIRALAX) powder Take 17 g by mouth daily. 02/25/14  Yes Sharee Holster, NP  ranitidine (ZANTAC) 150 MG capsule Take 150 mg by mouth at  bedtime.   Yes Historical Provider, MD  sertraline (ZOLOFT) 50 MG tablet Take 50 mg by mouth at bedtime.    Yes Historical Provider, MD    Family History No family history on file.  Social History Social History  Substance Use Topics  . Smoking status: Former Games developermoker  . Smokeless tobacco: Never Used  . Alcohol use No     Allergies   Review of patient's allergies indicates no known allergies.   Review of Systems Review of Systems  Unable to perform ROS: Intubated     Physical Exam Updated Vital Signs BP (!) 91/27   Pulse 78   Temp 98.3 F (36.8 C) (Oral)   Resp  (!) 28   Ht 5\' 7"  (1.702 m)   Wt 79.4 kg   SpO2 99%   BMI 27.42 kg/m   Physical Exam  Constitutional: She appears well-developed and well-nourished. No distress.  HENT:  Head: Normocephalic and atraumatic.  Eyes: Conjunctivae are normal.  Neck: Neck supple.  Cardiovascular: Normal rate and regular rhythm.   No murmur heard. Pulmonary/Chest: Effort normal and breath sounds normal. No respiratory distress.  Abdominal: Soft. There is no tenderness.  Musculoskeletal: She exhibits no edema.  Skin: Skin is warm and dry.  Psychiatric:  Altered prior to intubation.  Nursing note and vitals reviewed.    ED Treatments / Results  Labs (all labs ordered are listed, but only abnormal results are displayed) Labs Reviewed  CULTURE, BLOOD (ROUTINE X 2) - Abnormal; Notable for the following:       Result Value   Culture STAPHYLOCOCCUS SPECIES (COAGULASE NEGATIVE) (*)    All other components within normal limits  CBC WITH DIFFERENTIAL/PLATELET - Abnormal; Notable for the following:    WBC 10.8 (*)    RBC 3.66 (*)    Hemoglobin 10.2 (*)    HCT 32.7 (*)    Neutro Abs 8.2 (*)    All other components within normal limits  COMPREHENSIVE METABOLIC PANEL - Abnormal; Notable for the following:    Chloride 117 (*)    CO2 18 (*)    Glucose, Bld 196 (*)    BUN 29 (*)    Creatinine, Ser 1.58 (*)    Calcium 7.6 (*)    Total Protein 5.3 (*)    Albumin 2.6 (*)    ALT 13 (*)    GFR calc non Af Amer 31 (*)    GFR calc Af Amer 36 (*)    All other components within normal limits  URINE RAPID DRUG SCREEN, HOSP PERFORMED - Abnormal; Notable for the following:    Benzodiazepines POSITIVE (*)    All other components within normal limits  URINALYSIS, ROUTINE W REFLEX MICROSCOPIC (NOT [facility]) - Abnormal; Notable for the following:    Color, Urine AMBER (*)    APPearance TURBID (*)    Hgb urine dipstick LARGE (*)    Bilirubin Urine SMALL (*)    Protein, ur 100 (*)    Leukocytes, UA LARGE (*)     All other components within normal limits  AMMONIA - Abnormal; Notable for the following:    Ammonia 42 (*)    All other components within normal limits  LACTIC ACID, PLASMA - Abnormal; Notable for the following:    Lactic Acid, Venous 4.6 (*)    All other components within normal limits  AMYLASE - Abnormal; Notable for the following:    Amylase 442 (*)    All other components within normal limits  LACTIC ACID, PLASMA - Abnormal; Notable for the following:    Lactic Acid, Venous 4.9 (*)    All other components within normal limits  LACTIC ACID, PLASMA - Abnormal; Notable for the following:    Lactic Acid, Venous 4.0 (*)    All other components within normal limits  PROTIME-INR - Abnormal; Notable for the following:    Prothrombin Time 17.5 (*)    All other components within normal limits  CBC - Abnormal; Notable for the following:    WBC 3.2 (*)    RBC 3.79 (*)    Hemoglobin 10.5 (*)    HCT 34.8 (*)    All other components within normal limits  BLOOD GAS, ARTERIAL - Abnormal; Notable for the following:    pH, Arterial 7.123 (*)    pO2, Arterial 152 (*)    Bicarbonate 14.9 (*)    Acid-base deficit 12.7 (*)    All other components within normal limits  GLUCOSE, CAPILLARY - Abnormal; Notable for the following:    Glucose-Capillary 174 (*)    All other components within normal limits  LACTIC ACID, PLASMA - Abnormal; Notable for the following:    Lactic Acid, Venous 4.5 (*)    All other components within normal limits  URINE MICROSCOPIC-ADD ON - Abnormal; Notable for the following:    Squamous Epithelial / LPF 0-5 (*)    Bacteria, UA MANY (*)    All other components within normal limits  GLUCOSE, CAPILLARY - Abnormal; Notable for the following:    Glucose-Capillary 147 (*)    All other components within normal limits  GLUCOSE, CAPILLARY - Abnormal; Notable for the following:    Glucose-Capillary 150 (*)    All other components within normal limits  LACTIC ACID, PLASMA -  Abnormal; Notable for the following:    Lactic Acid, Venous 3.4 (*)    All other components within normal limits  GLUCOSE, CAPILLARY - Abnormal; Notable for the following:    Glucose-Capillary 161 (*)    All other components within normal limits  BASIC METABOLIC PANEL - Abnormal; Notable for the following:    Chloride 118 (*)    CO2 17 (*)    Glucose, Bld 246 (*)    BUN 32 (*)    Creatinine, Ser 1.88 (*)    Calcium 7.1 (*)    GFR calc non Af Amer 25 (*)    GFR calc Af Amer 29 (*)    All other components within normal limits  MAGNESIUM - Abnormal; Notable for the following:    Magnesium 1.4 (*)    All other components within normal limits  LACTATE DEHYDROGENASE - Abnormal; Notable for the following:    LDH 200 (*)    All other components within normal limits  GLUCOSE, CAPILLARY - Abnormal; Notable for the following:    Glucose-Capillary 233 (*)    All other components within normal limits  GLUCOSE, CAPILLARY - Abnormal; Notable for the following:    Glucose-Capillary 181 (*)    All other components within normal limits  BASIC METABOLIC PANEL - Abnormal; Notable for the following:    CO2 20 (*)    Glucose, Bld 219 (*)    BUN 38 (*)    Creatinine, Ser 2.40 (*)    Calcium 7.1 (*)    GFR calc non Af Amer 19 (*)    GFR calc Af Amer 22 (*)    All other components within normal limits  GLUCOSE, CAPILLARY - Abnormal; Notable for the following:  Glucose-Capillary 146 (*)    All other components within normal limits  GLUCOSE, CAPILLARY - Abnormal; Notable for the following:    Glucose-Capillary 155 (*)    All other components within normal limits  GLUCOSE, CAPILLARY - Abnormal; Notable for the following:    Glucose-Capillary 147 (*)    All other components within normal limits  GLUCOSE, CAPILLARY - Abnormal; Notable for the following:    Glucose-Capillary 202 (*)    All other components within normal limits  I-STAT ARTERIAL BLOOD GAS, ED - Abnormal; Notable for the following:     pH, Arterial 7.222 (*)    pO2, Arterial 289.0 (*)    Bicarbonate 18.7 (*)    Acid-base deficit 9.0 (*)    All other components within normal limits  POCT I-STAT 3, ART BLOOD GAS (G3+) - Abnormal; Notable for the following:    pH, Arterial 7.180 (*)    pO2, Arterial 429.0 (*)    Bicarbonate 14.4 (*)    Acid-base deficit 13.0 (*)    All other components within normal limits  CULTURE, BLOOD (ROUTINE X 2)  URINE CULTURE  MRSA PCR SCREENING  C DIFFICILE QUICK SCREEN W PCR REFLEX  GASTROINTESTINAL PANEL BY PCR, STOOL (REPLACES STOOL CULTURE)  MAGNESIUM  PHOSPHORUS  LIPASE, BLOOD  PROCALCITONIN  BRAIN NATRIURETIC PEPTIDE  CORTISOL  APTT  CK  PHOSPHORUS  MAGNESIUM  PHOSPHORUS  I-STAT TROPOININ, ED    EKG  EKG Interpretation  Date/Time:  Tuesday Dec 01, 2015 16:46:30 EDT Ventricular Rate:  81 PR Interval:    QRS Duration: 98 QT Interval:  403 QTC Calculation: 468 R Axis:   7 Text Interpretation:  Sinus rhythm No significant change was found Confirmed by CAMPOS  MD, KEVIN ([id number]) on 12/01/2015 4:56:45 PM       Radiology Ct Head Wo Contrast  Result Date: 2015-12-01 CLINICAL DATA:  Altered mental status. EXAM: CT HEAD WITHOUT CONTRAST TECHNIQUE: Contiguous axial images were obtained from the base of the skull through the vertex without intravenous contrast. COMPARISON:  CT scan of January 17, 2010. FINDINGS: Bony calvarium appears intact. Mild diffuse cortical atrophy is noted. Right frontal and parietal encephalomalacia is noted consistent with old infarctions. Left posterior parietal encephalomalacia is also noted consistent with old infarction. No mass effect or midline shift is noted. Ventricular size is within normal limits. There is no evidence of mass lesion, hemorrhage or acute infarction. IMPRESSION: Mild diffuse cortical atrophy. 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 No acute intracranial abnormality seen. Electronically Signed   By: Lupita Raider, M.D.   On: December 01, 2015  19:21   Ct Chest W Contrast  Result Date: 12-01-2015 CLINICAL DATA:  Found down at nursing facility today. Aspiration, hypotensive. Followup severe aspiration pneumonia. History of diabetes, stroke and dementia. EXAM: CT CHEST, ABDOMEN, AND PELVIS WITH CONTRAST TECHNIQUE: Multidetector CT imaging of the chest, abdomen and pelvis was performed following the standard protocol during bolus administration of intravenous contrast. CONTRAST:  75mL ISOVUE-300 IOPAMIDOL (ISOVUE-300) INJECTION 61% COMPARISON:  Chest radiograph Dec 01, 2015 at 1724 hours FINDINGS: CT CHEST FINDINGS CARDIOVASCULAR: Heart size is normal. Apical thinning LEFT ventricle. Trace pericardial effusion. Moderate calcific atherosclerosis of the thoracic aorta which is normal in course and caliber. MEDIASTINUM/NODES: No mediastinal mass. No lymphadenopathy by CT size criteria. Normal appearance of thoracic esophagus though not tailored for evaluation. LUNGS/PLEURA: Endotracheal tube tip just above the carina. Debris within the RIGHT lower lobe bronchi. Mild LEFT lower lobe bronchiectasis. Diffuse tree-in-bud infiltrates, and ground-glass centrilobular nodules with RIGHT  lower lobe consolidation. No pleural effusion. No pneumothorax. MUSCULOSKELETAL: Heterogeneous thyroid gland without dominant nodule. Coarse calcifications LEFT breast. Osteopenia. Linear sclerosis RIGHT T8 rib, nonspecific and nonacute. CT ABDOMEN PELVIS FINDINGS HEPATOBILIARY: Liver and gallbladder are normal. PANCREAS: Normal. SPLEEN: Normal. ADRENALS/URINARY TRACT: Kidneys are orthotopic, demonstrating symmetric enhancement. No nephrolithiasis, hydronephrosis or solid renal masses. The unopacified ureters are normal in course and caliber. Delayed imaging through the kidneys without contrast excretion. Urinary bladder is partially distended and unremarkable. Normal adrenal glands. STOMACH/BOWEL: Stool distended rectum. Colonic air-fluid levels, fluid and small bowel.  Nasogastric tube terminates in mid stomach. Colonic intra positioning. No bowel obstruction. VASCULAR/LYMPHATIC: Aortoiliac vessels are normal in course and caliber, a chronic calcified infrarenal aortic dissection. Moderate to severe calcific atherosclerosis. 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 No lymphadenopathy by CT size criteria. REPRODUCTIVE: Normal. OTHER: No intraperitoneal free fluid or free air. MUSCULOSKELETAL: Nonacute. IMPRESSION: CT CHEST: Diffuse tree-in-bud infiltrates, ground-glass opacities in RIGHT lower lobe consolidation consistent with aspiration pneumonia. Debris in RIGHT lower lobe bronchi. Endotracheal tube tip just above the carina. CT ABDOMEN AND PELVIS: Stool distended rectum. Fluid in small and large bowel suggest enteritis without bowel obstruction. Nasogastric tube tip in mid stomach. 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 Electronically Signed   By: Awilda Metro M.D.   On: 2015-12-14 19:30   Ct Abdomen Pelvis W Contrast  Result Date: 12-14-15 CLINICAL DATA:  Found down at nursing facility today. Aspiration, hypotensive. Followup severe aspiration pneumonia. History of diabetes, stroke and dementia. EXAM: CT CHEST, ABDOMEN, AND PELVIS WITH CONTRAST TECHNIQUE: Multidetector CT imaging of the chest, abdomen and pelvis was performed following the standard protocol during bolus administration of intravenous contrast. CONTRAST:  75mL ISOVUE-300 IOPAMIDOL (ISOVUE-300) INJECTION 61% COMPARISON:  Chest radiograph 12/14/15 at 1724 hours FINDINGS: CT CHEST FINDINGS CARDIOVASCULAR: Heart size is normal. Apical thinning LEFT ventricle. Trace pericardial effusion. Moderate calcific atherosclerosis of the thoracic aorta which is normal in course and caliber. MEDIASTINUM/NODES: No mediastinal mass. No lymphadenopathy by CT size criteria. Normal appearance of thoracic esophagus though not tailored for evaluation. LUNGS/PLEURA:  Endotracheal tube tip just above the carina. Debris within the RIGHT lower lobe bronchi. Mild LEFT lower lobe bronchiectasis. Diffuse tree-in-bud infiltrates, and ground-glass centrilobular nodules with RIGHT lower lobe consolidation. No pleural effusion. No pneumothorax. MUSCULOSKELETAL: Heterogeneous thyroid gland without dominant nodule. Coarse calcifications LEFT breast. Osteopenia. Linear sclerosis RIGHT T8 rib, nonspecific and nonacute. CT ABDOMEN PELVIS FINDINGS HEPATOBILIARY: Liver and gallbladder are normal. PANCREAS: Normal. SPLEEN: Normal. ADRENALS/URINARY TRACT: Kidneys are orthotopic, demonstrating symmetric enhancement. No nephrolithiasis, hydronephrosis or solid renal masses. The unopacified ureters are normal in course and caliber. Delayed imaging through the kidneys without contrast excretion. Urinary bladder is partially distended and unremarkable. Normal adrenal glands. STOMACH/BOWEL: Stool distended rectum. Colonic air-fluid levels, fluid and small bowel. Nasogastric tube terminates in mid stomach. Colonic intra positioning. No bowel obstruction. VASCULAR/LYMPHATIC: Aortoiliac vessels are normal in course and caliber, a chronic calcified infrarenal aortic dissection. Moderate to severe calcific atherosclerosis. 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 No lymphadenopathy by CT size criteria. REPRODUCTIVE: Normal. OTHER: No intraperitoneal free fluid or free air. MUSCULOSKELETAL: Nonacute. IMPRESSION: CT CHEST: Diffuse tree-in-bud infiltrates, ground-glass opacities in RIGHT lower lobe consolidation consistent with aspiration pneumonia. Debris in RIGHT lower lobe bronchi. Endotracheal tube tip just above the carina. CT ABDOMEN AND PELVIS: Stool distended rectum. Fluid in small and large bowel suggest enteritis without bowel obstruction. Nasogastric tube tip in mid stomach. Occluded  LEFT internal iliac and RIGHT femoral artery's. Electronically Signed   By: Awilda Metro M.D.   On: 11/27/2015 19:30   Dg Chest Port 1 View  Result Date: 11/17/2015 CLINICAL DATA:  Central line placement EXAM: PORTABLE CHEST 1 VIEW COMPARISON:  Earlier today at 0429 hours. FINDINGS: 0910 hours. Endotracheal tube terminates 4.7 cm above carina.Nasogastric tube extends beyond the inferior aspect of the film. Left internal jugular line with tip at low SVC or cavoatrial junction. Midline trachea. Normal heart size. Atherosclerosis in the transverse aorta. No pleural effusion or pneumothorax. Minimal progression of perihilar interstitial and airspace opacities. Slightly lower lobe predominant. IMPRESSION: Appropriate position of left internal jugular line, without pneumothorax. Worsened aeration, suspicious for pulmonary edema and/or infection/aspiration. Electronically Signed   By: Jeronimo Greaves M.D.   On: 11/17/2015 09:24   Dg Chest Port 1 View  Result Date: 11/17/2015 CLINICAL DATA:  Endotracheal tube check.  Acute respiratory failure. EXAM: PORTABLE CHEST 1 VIEW COMPARISON:  Chest radiograph 11/22/2015 and chest CT 11/15/2015 FINDINGS: Support apparatus is unchanged. There is improved aeration at the right lung base. Patchy parenchymal opacities in the left lung have worsened from the prior study. No pneumothorax or sizable pleural effusion. Cardiomediastinal contours are unchanged. IMPRESSION: 1. Worsening opacities within the left lung, possibly indicating pulmonary edema versus progression of aspiration pneumonitis. 2. Improved aeration of the right lung base. Electronically Signed   By: Deatra Robinson M.D.   On: 11/17/2015 06:21   Dg Chest Portable 1 View  Result Date: 11/27/2015 CLINICAL DATA:  Endotracheal tube placement. Unresponsive patient. Altered mental status. EXAM: PORTABLE CHEST 1 VIEW COMPARISON:  06/27/2015 FINDINGS: Endotracheal tube tip 2.5 cm above the carina. Nasogastric tube enters the stomach. Worsened volume loss/ atelectasis involving the right middle lobe, a  component of superimposed right middle lobe pneumonia is difficult to exclude. Degenerative glenohumeral arthropathy. Indistinct linear opacities in the left perihilar region and left lung base. These are increased from prior. Elevated right hemidiaphragm. IMPRESSION: 1. Airspace opacity primarily in the right middle lobe with elevated right hemidiaphragm. Appearance favors atelectasis although a component of pneumonia in the right middle lobe is not excluded. 2. ET tube well positioned, 2.5 cm above the carina. 3. Linear perihilar and basilar opacities on the left, bronchopneumonia not excluded, new compared to April 2017. 4. Tortuous and atherosclerotic thoracic aorta. Electronically Signed   By: Gaylyn Rong M.D.   On: 11/26/2015 17:39    Procedures .Intubation Date/Time: 12/08/2015 4:50 PM Performed by: Ruthell Rummage, Falecia Vannatter Authorized by: Ruthell Rummage, Lauri Purdum   Consent:    Consent obtained:  Emergent situation   Consent given by:  Healthcare agent Pre-procedure details:    Patient status:  Altered mental status   Mallampati score:  I   Pretreatment medications:  None   Paralytics:  Rocuronium Procedure details:    Preoxygenation:  Bag valve mask   CPR in progress: no     Intubation method:  Oral   Oral intubation technique:  Video-assisted   Laryngoscope blade:  Mac 3   Tube size (mm):  7.5   Number of attempts:  1   Cricoid pressure: no     Tube visualized through cords: yes   Placement assessment:    ETT to lip:  22   Tube secured with:  ETT holder   Breath sounds:  Equal   Placement verification: chest rise, condensation, CXR verification, direct visualization, equal breath sounds, ETCO2 detector and tube exhalation   Post-procedure details:  Patient tolerance of procedure:  Tolerated well, no immediate complications   (including critical care time)  Medications Ordered in ED Medications  midazolam (VERSED) 2 MG/2ML injection (not administered)  fentaNYL (SUBLIMAZE) 100 MCG/2ML  injection (not administered)  chlorhexidine (PERIDEX) 0.12 % solution 15 mL (15 mLs Mouth Rinse Given 11/18/15 0800)  MEDLINE mouth rinse (15 mLs Mouth Rinse Not Given 11/18/15 1600)  norepinephrine (LEVOPHED) 16 mg in dextrose 5 % 250 mL (0.064 mg/mL) infusion (40 mcg/min Intravenous New Bag/Given 11/18/15 1409)  morphine bolus via infusion 5-20 mg (not administered)  morphine 250 mg in dextrose 5 % 250 mL (1 mg/mL) infusion (2 mg/hr Intravenous New Bag/Given 11/18/15 1100)  etomidate (AMIDATE) injection 22.6 mg (22.6 mg Intravenous Given 11/24/2015 1639)  sodium chloride 0.9 % bolus 1,000 mL (1,000 mLs Intravenous New Bag/Given 12/04/2015 1653)  fentaNYL (SUBLIMAZE) injection 50 mcg (50 mcg Intravenous Given 11/21/2015 1653)  midazolam (VERSED) injection 4 mg (2 mg Intravenous Given 11/26/2015 1653)  ondansetron (ZOFRAN) injection 4 mg (4 mg Intravenous Given 11/12/2015 1704)  sodium chloride 0.9 % bolus 1,000 mL (1,000 mLs Intravenous New Bag/Given 12/11/2015 1710)  midazolam (VERSED) 2 MG/2ML injection (1 mg  Given 11/28/2015 1737)  fentaNYL (SUBLIMAZE) 100 MCG/2ML injection (50 mcg  Given 11/26/2015 1736)  vancomycin (VANCOCIN) 1,500 mg in sodium chloride 0.9 % 500 mL IVPB (1,500 mg Intravenous Given 11/24/2015 2130)  piperacillin-tazobactam (ZOSYN) IVPB 3.375 g (3.375 g Intravenous Given 11/26/2015 2016)  fentaNYL (SUBLIMAZE) injection 100 mcg (100 mcg Intravenous Given 12/04/2015 1820)  iopamidol (ISOVUE-300) 61 % injection (75 mLs  Contrast Given 12/08/2015 1842)  sodium chloride 0.9 % bolus 1,000 mL (1,000 mLs Intravenous Given 11/17/2015 1953)  sodium chloride 0.9 % bolus 1,000 mL (1,000 mLs Intravenous Given 11/18/2015 2139)  sodium chloride 0.9 % bolus 1,000 mL (1,000 mLs Intravenous Given 11/17/15 0335)  sodium chloride 0.9 % bolus 500 mL (500 mLs Intravenous Given 11/17/15 0600)  magnesium sulfate IVPB 2 g 50 mL (2 g Intravenous Given by Other 11/17/15 1232)     Initial Impression / Assessment and Plan / ED Course  I have reviewed the triage  vital signs and the nursing notes.  Pertinent labs & imaging results that were available during my care of the patient were reviewed by me and considered in my medical decision making (see chart for details).  Clinical Course    Patient is a 75 year old female with a complicated medical past medical history. Patient was brought in by EMS from [facility] where she had not been seen for a few days. Patient was found and was unresponsive. Patient brought to the ED where patient was vomiting and likely aspirated. Patient was intubated.  Physical exam: Patient with coarse breath sounds diffusely. Normal S1-S2 no rubs murmurs gallops. Abdomen soft. No visual signs of trauma. Pupils equal round reactive.  We'll conduct broad workup including CT head and abdomen pelvis.  Workup concerning for UTI, patient with elevated lactate we will additionally collect blood cultures as well as urine culture.  Patient also with AKI as well as multiple electrolyte derangements.  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  CT also concern for bowel obstruction.  Patient admitted to ICU for further care.   Final Clinical Impressions(s) / ED Diagnoses   Final diagnoses:  Acute respiratory failure, unspecified whether with hypoxia or hypercapnia (HCC)  Nausea and vomiting, vomiting of unspecified type  Shock (HCC)  New Prescriptions Current Discharge Medication List       Caren Griffins, MD 11/18/15 1721    Azalia Bilis, MD 11/19/15 [phone]

## 2015-11-16 NOTE — Progress Notes (Addendum)
Pharmacy Antibiotic Note  [name] [name] is a 75 y.o. female admitted on 11/17/2015 after being found unresponsive at nursing facility. Pharmacy has been consulted for vancomycin and zosyn dosing. Patient intubated and also has suspected aspiration event. BP running low 60s-100s systolic, now requiring levophed. Temperature low at 94.4, WBC 10.8, and lactate 4.6. SCr 1.58 with baseline around 1.   Plan: Vancomcyin 1.5g IV once then  Vancomycin 1000 mg  IV every 24 hours.  Goal trough 15-20 mcg/mL. Zosyn 3.375g IV q8h (4 hour infusion).  Vancomycin trough at steady state and as needed  Monitor renal function and clinical picture Follow-up cultures and antibiotic length of therapy   Temp (24hrs), Avg:94.4 F (34.7 C), Min:94.4 F (34.7 C), Max:94.4 F (34.7 C)   Recent Labs Lab January 21, 2016 1723 January 21, 2016 1725  WBC  --  10.8*  CREATININE  --  1.58*  LATICACIDVEN 4.6*  --     Estimated Creatinine Clearance: 31 mL/min (by C-G formula based on SCr of 1.58 mg/dL).    No Known Allergies  Antimicrobials this admission: 9/5 Vanc  >>  9/5 Zosyn  >>   Dose adjustments this admission: N/A   Microbiology results: 9/5 BCx: pending  9/5 MRSA PCR: pending  9/5 UCx: to be collected 9/5 Sputum: to be collected   Thank you for allowing pharmacy to be a part of this patient's care.  York CeriseKatherine Cook, PharmD Pharmacy Resident  Pager [phone] January 21, 2016 6:21 PM

## 2015-11-16 NOTE — H&P (Signed)
PULMONARY / CRITICAL CARE MEDICINE   Name: [name] [name] MRN: [id number] DOB: [birth year]    ADMISSION DATE:  12/04/2015 CONSULTATION DATE:  12/10/2015  REFERRING MD:  Dr. Patria Mane EDP  CHIEF COMPLAINT:  Unresponsive  HISTORY OF PRESENT ILLNESS:   75 year old female with PMH as below, which is significant for DM, HSV, and dementia. She resides at [facility] where she had not been seen for a few days. When staff checked on her 9/5 she was found down and was unresponsive. EMS was called and she was transported to ED. She was intubated for airway protection and was noted to have vomitus on vocal cords, which was also aspirated from ETT. Initially she was hypotensive in ED, which has since improved with IVF resuscitation. CXR consistent with severe aspiration pneumonia. PCCM to admit.  PAST MEDICAL HISTORY :  She  has a past medical history of Altered mental status; Cerebrovascular disease; Congenital coronary artery anomaly; Constipation; Dementia; Depression; Diabetes mellitus; Esophageal reflux; Generalized abdominal pain; Herpes simplex; Hyperlipidemia; and Urinary tract infection.  PAST SURGICAL HISTORY: She  has no past surgical history on file.  No Known Allergies  No current facility-administered medications on file prior to encounter.    Current Outpatient Prescriptions on File Prior to Encounter  Medication Sig  . calcium carbonate (TUMS - DOSED IN MG ELEMENTAL CALCIUM) 500 MG chewable tablet Chew 2 tablets by mouth 2 (two) times daily.   . cholecalciferol (VITAMIN D) 1000 UNITS tablet Take 1,000 Units by mouth daily.   Marland Kitchen donepezil (ARICEPT) 5 MG tablet Take 5 mg by mouth at bedtime.  . insulin glargine (LANTUS) 100 UNIT/ML injection Inject 0.2 mLs (20 Units total) into the skin at bedtime.  . insulin lispro (HUMALOG) 100 UNIT/ML injection Inject as per sliding scale: if 0-150 =0 units, call MD if CBG < 60 ; 151-450 =5 call MD if CBG > 450  . lisinopril (PRINIVIL,ZESTRIL) 10  MG tablet Take 10 mg by mouth daily. Notify MD for blood sugar less than 60 and greater than 450  . Memantine HCl ER (NAMENDA XR) 21 MG CP24 Take 21 mg by mouth daily.  Bertram Gala Glycol-Propyl Glycol (SYSTANE) 0.4-0.3 % SOLN Apply 1 drop to eye 3 (three) times daily.  . polyethylene glycol powder (GLYCOLAX/MIRALAX) powder Take 17 g by mouth daily.  . ranitidine (ZANTAC) 150 MG capsule Take 150 mg by mouth at bedtime.  . sertraline (ZOLOFT) 50 MG tablet Take 50 mg by mouth at bedtime.     FAMILY HISTORY:  Her has no family status information on file.    SOCIAL HISTORY: She  reports that she has quit smoking. She has never used smokeless tobacco. She reports that she does not drink alcohol or use drugs.  REVIEW OF SYSTEMS:   unable  SUBJECTIVE:    VITAL SIGNS: BP (!) 76/53   Pulse 82   Resp 22   SpO2 94%   HEMODYNAMICS:    VENTILATOR SETTINGS: Vent Mode: PRVC FiO2 (%):  [40 %-100 %] 40 % Set Rate:  [18 bmp-24 bmp] 24 bmp Vt Set:  [440 mL] 440 mL PEEP:  [5 cmH20] 5 cmH20 Plateau Pressure:  [19 cmH20] 19 cmH20  INTAKE / OUTPUT: No intake/output data recorded.  PHYSICAL EXAMINATION: General:  Intubated, non-purposeful movement of all 4 extremities Neuro:  Agitated on vent HEENT:  [place]/AT, PERRL, no JVD Cardiovascular:  RRR, no MRG Lungs:  Coarse bilateral breath sounds Abdomen:  Soft, non-tender, non-distended Musculoskeletal:  No  acute deformity or ROM limitation Skin:  Grossly intact  LABS:  BMET  Recent Labs Lab 12/03/2015 1725  NA 142  K 3.8  CL 117*  CO2 18*  BUN 29*  CREATININE 1.58*  GLUCOSE 196*    Electrolytes  Recent Labs Lab 12/07/2015 1725  CALCIUM 7.6*    CBC  Recent Labs Lab 11/24/2015 1725  WBC 10.8*  HGB 10.2*  HCT 32.7*  PLT 151    Coag's No results for input(s): APTT, INR in the last 168 hours.  Sepsis Markers  Recent Labs Lab 11/29/2015 1723  LATICACIDVEN 4.6*    ABG  Recent Labs Lab 12/05/2015 1738  PHART  7.222*  PCO2ART 45.4  PO2ART 289.0*    Liver Enzymes  Recent Labs Lab 12/10/2015 1725  AST 25  ALT 13*  ALKPHOS 55  BILITOT 0.4  ALBUMIN 2.6*    Cardiac Enzymes No results for input(s): TROPONINI, PROBNP in the last 168 hours.  Glucose No results for input(s): GLUCAP in the last 168 hours.  Imaging Dg Chest Portable 1 View  Result Date: 11/12/2015 CLINICAL DATA:  Endotracheal tube placement. Unresponsive patient. Altered mental status. EXAM: PORTABLE CHEST 1 VIEW COMPARISON:  06/27/2015 FINDINGS: Endotracheal tube tip 2.5 cm above the carina. Nasogastric tube enters the stomach. Worsened volume loss/ atelectasis involving the right middle lobe, a component of superimposed right middle lobe pneumonia is difficult to exclude. Degenerative glenohumeral arthropathy. Indistinct linear opacities in the left perihilar region and left lung base. These are increased from prior. Elevated right hemidiaphragm. IMPRESSION: 1. Airspace opacity primarily in the right middle lobe with elevated right hemidiaphragm. Appearance favors atelectasis although a component of pneumonia in the right middle lobe is not excluded. 2. ET tube well positioned, 2.5 cm above the carina. 3. Linear perihilar and basilar opacities on the left, bronchopneumonia not excluded, new compared to April 2017. 4. Tortuous and atherosclerotic thoracic aorta. Electronically Signed   By: Gaylyn RongWalter  Liebkemann M.D.   On: 12/06/2015 17:39     STUDIES:  CT head 9/5 > CT chest 9/5 > CT abdomen/pelvis 9/5 >  CULTURES: BCx2 9/5 > Tracheal aspirate 9/5 > Urine CX 9/5  ANTIBIOTICS: Zosyn 9/5 > Vancomycin 9/5 >  SIGNIFICANT EVENTS:   LINES/TUBES: ETT 9/5 >  DISCUSSION: 75 year old female who resides in SNF found down. Intubated in ICU. Admit for aspiration PNA. Treat with zosyn and vanco. Maintain on vent. BP improved with volume. Monitor closely.   ASSESSMENT / PLAN:  PULMONARY A: Acute hypoxemic respiratory failure  secondary to aspiration PNA  P:   Full vent support Follow ABG CXR in AM Vent bundle  CARDIOVASCULAR A:  Hypotension, likely secondary to hypovolemia >improved with volume. Hx HLD, HTN   P:  Telemetry monitoring MAP goal > 65mmHg Volume for MAP goal Ensure lactic clearing Trend out troponin  RENAL A:   AKI Likely rhabdomyolysis + dehydration  P:   IVF resuscitation CK Follow BMP  GASTROINTESTINAL A:   GERD  P:   NPO for now Pepcid for SUP  HEMATOLOGIC A:   Anemia, mild (at baseline) P:  SCDs No VTE chemoprophylaxis until head CT clear. Follow CBC  INFECTIOUS A:   Aspiration pneumonia  P:   ABX as above PCT Cultures  ENDOCRINE A:   DM   P:   Hold home lantus/insulin CBG monitoring and SSI  NEUROLOGIC A:   Acute metabolic encephalopathy Vascular dementia Hx CVA  P:   RASS goal: -1 to -2 Fentanyl gtt  Versed PRN CT head Holding home aricept, namenda, zoloft  FAMILY  - Updates:   - Inter-disciplinary family meet or Palliative Care meeting due by:  9/12   Joneen Roach, AGACNP-BC [facility] Pager [phone] or [phone]  November 27, 2015 6:02 PM  Attending Note:  75 year old female with mild dementia [facility] who was found unresponsive and brought in to the ED where she was vomiting and aspirated.  Intubated via EDP and PCCM called to admit.  No labs are back at this point but WBC of 10.8 and CXR with RLL infiltrate.  Coarse BS diffusely on exam.  Will admit to the ICU.  Increase RR to 24 for mixed acidosis.  Head/Chest/Abdomen/Pelvis CT ordered and pending.  If unable to obtain IV access then will need to place a central line.  No family bedside, presumed full code for now.  The patient is critically ill with multiple organ systems failure and requires high complexity decision making for assessment and support, frequent evaluation and titration of therapies, application of advanced monitoring  technologies and extensive interpretation of multiple databases.   Critical Care Time devoted to patient care services described in this note is  35  Minutes. This time reflects time of care of this signee Dr Koren Bound. This critical care time does not reflect procedure time, or teaching time or supervisory time of PA/NP/Med student/Med Resident etc but could involve care discussion time.  Alyson Reedy, M.D. [facility] Pulmonary/Critical Care Medicine. Pager: [phone]. After hours pager: [phone].

## 2015-11-16 NOTE — Progress Notes (Signed)
Location:   [facility]   Place of Service:  SNF (31)   CODE STATUS: full code   No Known Allergies  Chief Complaint  Patient presents with  . Acute Visit    aspiration     HPI:  She was after after vomiting and a liquid stool. She is not responsive to any stimuli she has agonal breathing. She was placed on non-rebreather unable to obtain 02 sat. Her cbg 195.   Past Medical History:  Diagnosis Date  . Altered mental status   . Cerebrovascular disease   . Congenital coronary artery anomaly   . Constipation   . Dementia   . Depression   . Diabetes mellitus   . Esophageal reflux   . Generalized abdominal pain   . Herpes simplex   . Hyperlipidemia   . Urinary tract infection     No past surgical history on file.  Social History   Social History  . Marital status: Widowed    Spouse name: N/A  . Number of children: N/A  . Years of education: N/A   Occupational History  . Not on file.   Social History Main Topics  . Smoking status: Former Games developermoker  . Smokeless tobacco: Never Used  . Alcohol use No  . Drug use: No  . Sexual activity: Not on file   Other Topics Concern  . Not on file   Social History Narrative  . No narrative on file   No family history on file.    VITAL SIGNS BP (!) 60/40   Pulse (!) 58   Resp 12   Ht 5\' 4"  (1.626 m)   Wt 166 lb (75.3 kg)   BMI 28.49 kg/m   Patient's Medications  New Prescriptions   No medications on file  Previous Medications   CALCIUM CARBONATE (TUMS - DOSED IN MG ELEMENTAL CALCIUM) 500 MG CHEWABLE TABLET    Chew 2 tablets by mouth 2 (two) times daily.    CHOLECALCIFEROL (VITAMIN D) 1000 UNITS TABLET    Take 1,000 Units by mouth daily.    DONEPEZIL (ARICEPT) 5 MG TABLET    Take 5 mg by mouth at bedtime.   INSULIN GLARGINE (LANTUS) 100 UNIT/ML INJECTION    Inject 0.2 mLs (20 Units total) into the skin at bedtime.   INSULIN LISPRO (HUMALOG) 100 UNIT/ML INJECTION    Inject as per sliding scale: if 0-150 =0  units, call MD if CBG < 60 ; 151-450 =5 call MD if CBG > 450   LISINOPRIL (PRINIVIL,ZESTRIL) 10 MG TABLET    Take 10 mg by mouth daily. Notify MD for blood sugar less than 60 and greater than 450   MEMANTINE HCL ER (NAMENDA XR) 21 MG CP24    Take 21 mg by mouth daily.   POLYETHYL GLYCOL-PROPYL GLYCOL (SYSTANE) 0.4-0.3 % SOLN    Apply 1 drop to eye 3 (three) times daily.   POLYETHYLENE GLYCOL POWDER (GLYCOLAX/MIRALAX) POWDER    Take 17 g by mouth daily.   RANITIDINE (ZANTAC) 150 MG CAPSULE    Take 150 mg by mouth at bedtime.   SERTRALINE (ZOLOFT) 50 MG TABLET    Take 50 mg by mouth at bedtime.   Modified Medications   No medications on file  Discontinued Medications   No medications on file     SIGNIFICANT DIAGNOSTIC EXAMS   07-13-14: dexa: t score -2.5  08-05-14: mammogram: No mammographic evidence of malignancy. A result letter of this screening mammogram will be mailed  directly to the patient.  10-29-14: chest x-ray: 1. No acute findings. 2. Atherosclerotic aortic arch.    LABS REVIEWED:   10-07-14: wbc 4.4; hgb 9.9; hct 33.4; mcv 90.2 plt 149; glucose 67; bun 24; creat 0.94; k+ 4.5; na++142; liver normal albumin 3.3 10-09-14: urine for micro-albumin 1.2 (2015: 2.5 ) 10-29-14: hgb 12.9; hct 38.0  glucose 164; bun 26; creat 1.20; k+4.9; na++143;  12-16-14: hgb a1c 7.5  02-25-15: wbc 5.1; hgb 9.8; hct 31.5; mcv 87.9; plt 164; glucose 73; bun 32.7; creat 1.00; k+ 4.5; na++143; ;liver normal albumin 3.2; tsh 0.47; vit B12: 490; folic acid 7.8; free T4: 0.97 03-25-15: chol 208; ldl 125; trig 144; hdl 55  07-01-15: hgb a1c 6.8  09-01-15: wbc 5.5; hgb 10.6; hct 35.9; mcv 87.8; plt 215; glucose 143; bun 20.6; creat 0.87; k+ 4.7; na++ 141; liver normal albumin 3.5; hgb a1c 6.4; chol 195; ldl 110; trig 130; hdl 59    .  Review of Systems  Unable to perform ROS: Patient unresponsive     Physical Exam  Constitutional: She appears well-developed and well-nourished. She appears distressed.    Neck: No thyromegaly present.  Cardiovascular: Intact distal pulses.   Heart rate irregular   Respiratory:  Agonal respirations Diminished throughout  GI: Soft. Bowel sounds are normal.  Neurological:  non responsive to all stimuli    Skin: She is diaphoretic.  Diaphoretic         ASSESSMENT/ PLAN:   1. Aspiration:  2. Altered mental status 3. Questionable cva  Will sent to the ED for further evaluation and treatment options.    Time spent with patient 45   minutes >50% time spent counseling; reviewing medical record; tests; labs; and developing future plan of care   Synthia Innocent NP [facility]  Contact [phone] Monday through Friday 8am- 5pm  After hours call [phone]

## 2015-11-16 NOTE — Progress Notes (Signed)
Patient brought in by EMS due to being unresponsive.  Patient was intubated by ED MD without complications.  Positive color change noted.  Positive condensation noted.  Bilateral breath sounds heard.  Chest xray pending for tube placement.  No complications noted.

## 2015-11-16 NOTE — Progress Notes (Signed)
CRITICAL VALUE ALERT  Critical value received:  Lactic Acid 4.9  Date of notification:  11/26/2015  Time of notification:  2135  Critical value read back: yes  Nurse who received alert:  Roselie AwkwardMegan Nyzaiah Kai, RN   MD notified (1st page):  Dr.Sommer  Time of first page:  2136  MD notified (2nd page):  Time of second page:  Responding MD:  Dr. Arsenio LoaderSommer  Time MD responded:  2136

## 2015-11-16 NOTE — ED Notes (Signed)
Soiled pt cleaned and placed on new dry linen.

## 2015-11-16 NOTE — Progress Notes (Signed)
eLink Physician-Brief Progress Note Patient Name: [name] [name] DOB: 04/01/1940 MRN: [id number]   Date of Service  12/06/2015  HPI/Events of Note  Lactic Acid = 4.6 >> 4.9.  eICU Interventions  Will order: 1. Bolus with 0.9 NaCl 1 liter IV over 1 hour. 2. Lactic Acid levels at 12 midnight, 3 AM and 6 AM.     Intervention Category Major Interventions: Acid-Base disturbance - evaluation and management  Lenell AntuSommer,Steven Eugene 11/19/2015, 9:36 PM

## 2015-11-17 ENCOUNTER — Inpatient Hospital Stay (HOSPITAL_COMMUNITY): Payer: Medicare Other

## 2015-11-17 DIAGNOSIS — R579 Shock, unspecified: Secondary | ICD-10-CM

## 2015-11-17 DIAGNOSIS — J9600 Acute respiratory failure, unspecified whether with hypoxia or hypercapnia: Secondary | ICD-10-CM

## 2015-11-17 LAB — CBC
HEMATOCRIT: 34.8 % — AB (ref 36.0–46.0)
HEMOGLOBIN: 10.5 g/dL — AB (ref 12.0–15.0)
MCH: 27.7 pg (ref 26.0–34.0)
MCHC: 30.2 g/dL (ref 30.0–36.0)
MCV: 91.8 fL (ref 78.0–100.0)
Platelets: 150 10*3/uL (ref 150–400)
RBC: 3.79 MIL/uL — ABNORMAL LOW (ref 3.87–5.11)
RDW: 14 % (ref 11.5–15.5)
WBC: 3.2 10*3/uL — ABNORMAL LOW (ref 4.0–10.5)

## 2015-11-17 LAB — POCT I-STAT 3, ART BLOOD GAS (G3+)
ACID-BASE DEFICIT: 13 mmol/L — AB (ref 0.0–2.0)
Bicarbonate: 14.4 mmol/L — ABNORMAL LOW (ref 20.0–28.0)
O2 SAT: 100 %
PCO2 ART: 38.7 mmHg (ref 32.0–48.0)
PH ART: 7.18 — AB (ref 7.350–7.450)
TCO2: 16 mmol/L (ref 0–100)
pO2, Arterial: 429 mmHg — ABNORMAL HIGH (ref 83.0–108.0)

## 2015-11-17 LAB — BASIC METABOLIC PANEL
Anion gap: 7 (ref 5–15)
BUN: 32 mg/dL — ABNORMAL HIGH (ref 6–20)
CALCIUM: 7.1 mg/dL — AB (ref 8.9–10.3)
CO2: 17 mmol/L — AB (ref 22–32)
CREATININE: 1.88 mg/dL — AB (ref 0.44–1.00)
Chloride: 118 mmol/L — ABNORMAL HIGH (ref 101–111)
GFR calc Af Amer: 29 mL/min — ABNORMAL LOW (ref >60)
GFR calc non Af Amer: 25 mL/min — ABNORMAL LOW (ref >60)
GLUCOSE: 246 mg/dL — AB (ref 65–99)
Potassium: 3.5 mmol/L (ref 3.5–5.1)
Sodium: 142 mmol/L (ref 135–145)

## 2015-11-17 LAB — LACTIC ACID, PLASMA
LACTIC ACID, VENOUS: 4.5 mmol/L — AB (ref 0.5–1.9)
LACTIC ACID, VENOUS: 3.4 mmol/L — AB (ref 0.5–1.9)
Lactic Acid, Venous: 4 mmol/L (ref 0.5–1.9)

## 2015-11-17 LAB — C DIFFICILE QUICK SCREEN W PCR REFLEX
C Diff antigen: NEGATIVE
C Diff interpretation: NOT DETECTED
C Diff toxin: NEGATIVE

## 2015-11-17 LAB — LACTATE DEHYDROGENASE: LDH: 200 U/L — AB (ref 98–192)

## 2015-11-17 LAB — GLUCOSE, CAPILLARY
GLUCOSE-CAPILLARY: 150 mg/dL — AB (ref 65–99)
GLUCOSE-CAPILLARY: 161 mg/dL — AB (ref 65–99)
GLUCOSE-CAPILLARY: 233 mg/dL — AB (ref 65–99)
GLUCOSE-CAPILLARY: 181 mg/dL — AB (ref 65–99)
Glucose-Capillary: 146 mg/dL — ABNORMAL HIGH (ref 65–99)
Glucose-Capillary: 155 mg/dL — ABNORMAL HIGH (ref 65–99)

## 2015-11-17 LAB — BLOOD GAS, ARTERIAL
Acid-base deficit: 12.7 mmol/L — ABNORMAL HIGH (ref 0.0–2.0)
BICARBONATE: 14.9 mmol/L — AB (ref 20.0–28.0)
DRAWN BY: 42624
FIO2: 100
MECHVT: 440 mL
O2 Saturation: 98 %
PATIENT TEMPERATURE: 98.6
PEEP/CPAP: 12 cmH2O
PH ART: 7.123 — AB (ref 7.350–7.450)
PO2 ART: 152 mmHg — AB (ref 83.0–108.0)
RATE: 24 resp/min
pCO2 arterial: 47.5 mmHg (ref 32.0–48.0)

## 2015-11-17 LAB — PHOSPHORUS: Phosphorus: 2.7 mg/dL (ref 2.5–4.6)

## 2015-11-17 LAB — MAGNESIUM: Magnesium: 1.4 mg/dL — ABNORMAL LOW (ref 1.7–2.4)

## 2015-11-17 MED ORDER — NOREPINEPHRINE BITARTRATE 1 MG/ML IV SOLN
0.0000 ug/min | INTRAVENOUS | Status: DC
  Administered 2015-11-17: 36 ug/min via INTRAVENOUS
  Administered 2015-11-18 – 2015-11-19 (×3): 40 ug/min via INTRAVENOUS
  Filled 2015-11-17 (×6): qty 16

## 2015-11-17 MED ORDER — MIDAZOLAM HCL 2 MG/2ML IJ SOLN: 1.0000 mg | INTRAMUSCULAR | Status: DC | PRN

## 2015-11-17 MED ORDER — SODIUM BICARBONATE 8.4 % IV SOLN
INTRAVENOUS | Status: DC
  Administered 2015-11-17 – 2015-11-18 (×2): via INTRAVENOUS
  Filled 2015-11-17 (×3): qty 150

## 2015-11-17 MED ORDER — HEPARIN SODIUM (PORCINE) 5000 UNIT/ML IJ SOLN
5000.0000 [IU] | Freq: Three times a day (TID) | INTRAMUSCULAR | Status: DC
  Administered 2015-11-17 – 2015-11-18 (×3): 5000 [IU] via SUBCUTANEOUS
  Filled 2015-11-17 (×4): qty 1

## 2015-11-17 MED ORDER — FENTANYL CITRATE (PF) 100 MCG/2ML IJ SOLN
50.0000 ug | INTRAMUSCULAR | Status: DC | PRN
  Administered 2015-11-17 (×2): 50 ug via INTRAVENOUS
  Filled 2015-11-17 (×2): qty 2

## 2015-11-17 MED ORDER — HYDROCORTISONE NA SUCCINATE PF 100 MG IJ SOLR
50.0000 mg | Freq: Four times a day (QID) | INTRAMUSCULAR | Status: DC
  Administered 2015-11-17 – 2015-11-18 (×4): 50 mg via INTRAVENOUS
  Filled 2015-11-17 (×2): qty 1
  Filled 2015-11-17: qty 2
  Filled 2015-11-17 (×2): qty 1
  Filled 2015-11-17: qty 2

## 2015-11-17 MED ORDER — SODIUM CHLORIDE 0.9 % IV BOLUS (SEPSIS)
1000.0000 mL | Freq: Once | INTRAVENOUS | Status: AC
  Administered 2015-11-17: 1000 mL via INTRAVENOUS

## 2015-11-17 MED ORDER — VANCOMYCIN HCL IN DEXTROSE 750-5 MG/150ML-% IV SOLN
750.0000 mg | INTRAVENOUS | Status: DC
  Administered 2015-11-17: 750 mg via INTRAVENOUS
  Filled 2015-11-17 (×2): qty 150

## 2015-11-17 MED ORDER — FENTANYL CITRATE (PF) 100 MCG/2ML IJ SOLN: 50.0000 ug | INTRAMUSCULAR | Status: DC | PRN

## 2015-11-17 MED ORDER — MIDAZOLAM HCL 2 MG/2ML IJ SOLN
1.0000 mg | INTRAMUSCULAR | Status: DC | PRN
  Administered 2015-11-17: 1 mg via INTRAVENOUS
  Filled 2015-11-17: qty 2

## 2015-11-17 MED ORDER — MAGNESIUM SULFATE 2 GM/50ML IV SOLN
2.0000 g | Freq: Once | INTRAVENOUS | Status: AC
  Administered 2015-11-17: 2 g via INTRAVENOUS
  Filled 2015-11-17: qty 50

## 2015-11-17 MED ORDER — SODIUM CHLORIDE 0.9 % IV BOLUS (SEPSIS)
500.0000 mL | Freq: Once | INTRAVENOUS | Status: AC
  Administered 2015-11-17: 500 mL via INTRAVENOUS

## 2015-11-17 NOTE — Progress Notes (Signed)
eLink Physician-Brief Progress Note Patient Name: [name] [name] DOB: 07/03/1940 MRN: [id number]   Date of Service  11/17/2015  HPI/Events of Note  Hypotension Worsening hypoxemia Less responsive  eICU Interventions  NS 1000 cc Recruitment, increase PEEP CXR now DC fentanyl infusion     Intervention Category Major Interventions: Other:  Merwyn KatosDavid B Simonds 11/17/2015, 3:33 AM

## 2015-11-17 NOTE — Progress Notes (Signed)
CRITICAL VALUE ALERT  Critical value received: Lactic Acid 4.5   Date of notification:  11/17/15  Time of notification: 0607  Critical value read back: yes  Nurse who received alert:  Roselie AwkwardMegan Amaryllis Malmquist, RN   MD notified (1st page):  Dr. Sung AmabileSimonds  Time of first page:  0608  MD notified (2nd page):  Time of second page:  Responding MD:  Dr. Sung AmabileSimonds  Time MD responded:  [phone]

## 2015-11-17 NOTE — Procedures (Signed)
Critical ABG results called to Dr. Sung AmabileSimonds. Vent changes made per order.

## 2015-11-17 NOTE — Progress Notes (Addendum)
Pharmacy Antibiotic Note  [name] [name] is a 75 y.o. female admitted on 12/03/2015 after being found unresponsive at nursing facility. Pharmacy has been consulted for vancomycin and Zosyn dosing for aspiration pneumonia. Afeb, WBC 3.2, lactate 4.6>3.4.  SCr up to 1.88 (CrCl ~ 28 ml/min), UOP low at 10 cc's every 2 hours per RN  Plan: -Decrease vancomycin to 750 mg IV q24h.  Goal trough 15-20 mcg/mL. -Continue Zosyn 3.375g IV q8h (4 hour infusion).  -Vancomycin trough at steady state and as needed  -Monitor renal function, UOP, clinical status -Follow-up cultures and antibiotic length of therapy   Temp (24hrs), Avg:96.8 F (36 C), Min:94 F (34.4 C), Max:98.8 F (37.1 C)   Recent Labs Lab 11-09-2015 1723 11-09-2015 1725 11-09-2015 2022 11-09-2015 2348 11/17/15 0500 11/17/15 0505 11/17/15 0812 11/17/15 1009  WBC  --  10.8*  --   --  3.2*  --   --   --   CREATININE  --  1.58*  --   --   --   --   --  1.88*  LATICACIDVEN 4.6*  --  4.9* 4.0*  --  4.5* 3.4*  --     Estimated Creatinine Clearance: 28.3 mL/min (by C-G formula based on SCr of 1.88 mg/dL).    No Known Allergies  Antimicrobials this admission: 9/5 Vanc  >>  9/5 Zosyn  >>   Dose adjustments this admission:  9/6 vancomycin 1000 mg >> 750 mg Q24H  Microbiology results: 9/5 BCx:    9/5 UCx:  9/5 MRSA PCR: neg 9/6 CDiff PCR: neg  Thank you for allowing pharmacy to be a part of this patient's care.  Mackie Paienee Ackley, PharmD PGY1 Pharmacy Resident Pager: [phone] 11/17/2015 12:41 PM   I discussed / reviewed the pharmacy note by Dr. Rande LawmanAckley and I agree with the resident's findings and plans as documented.  Link SnufferJessica Garland Smouse, PharmD, BCPS Clinical Pharmacist [phone] 11/17/2015, 4:18 PM

## 2015-11-17 NOTE — Progress Notes (Signed)
eLink Physician-Brief Progress Note Patient Name: [name] [name] DOB: 05/17/1940 MRN: [id number]   Date of Service  11/17/2015  HPI/Events of Note  Severe acidosis - mixed resp and metabolic  eICU Interventions  Vent rate increased NS changed to D5W + HCO3     Intervention Category Major Interventions: Acid-Base disturbance - evaluation and management;Respiratory failure - evaluation and management  Merwyn KatosDavid B Vu Liebman 11/17/2015, 4:41 AM

## 2015-11-17 NOTE — Progress Notes (Addendum)
PULMONARY / CRITICAL CARE MEDICINE   Name: [name] [name] MRN: [id number] DOB: [birth year]    ADMISSION DATE:  12-04-15 CONSULTATION DATE:  Dec 04, 2015  REFERRING MD:  Dr. Patria Mane EDP  CHIEF COMPLAINT:  Unresponsive  HISTORY OF PRESENT ILLNESS:   75 year old female with PMH as below, which is significant for DM, HSV, and dementia. She resides at [facility] where she had not been seen for a few days. When staff checked on her 9/5 she was found down and was unresponsive. EMS was called and she was transported to ED. She was intubated for airway protection and was noted to have vomitus on vocal cords, which was also aspirated from ETT. Initially she was hypotensive in ED, which has since improved with IVF resuscitation. CXR consistent with severe aspiration pneumonia. PCCM to admit.  SUBJECTIVE: BPs low, requiring levophed.    VITAL SIGNS: BP (!) 66/25   Pulse 93   Temp 97.2 F (36.2 C) (Oral)   Resp (!) 30   Wt 173 lb 8 oz (78.7 kg)   SpO2 100%   BMI 29.78 kg/m   HEMODYNAMICS:    VENTILATOR SETTINGS: Vent Mode: PRVC FiO2 (%):  [40 %-100 %] 100 % Set Rate:  [18 bmp-24 bmp] 24 bmp Vt Set:  [440 mL] 440 mL PEEP:  [5 cmH20-12 cmH20] 12 cmH20 Plateau Pressure:  [19 cmH20-28 cmH20] 28 cmH20  INTAKE / OUTPUT: I/O last 3 completed shifts: In: 2275.3 [I.V.:645.3; Other:30; IV Piggyback:1600] Out: 570 [Urine:125; Emesis/NG output:45; Stool:400]  PHYSICAL EXAMINATION: General:  Intubated, non-purposeful movement of all 4 extremities Neuro:  Agitated on vent HEENT:  [place]/AT, PERRL, no JVD Cardiovascular:  RRR, no MRG Lungs:  Coarse bilateral breath sounds Abdomen:  Soft, non-tender, non-distended Musculoskeletal:  No acute deformity or ROM limitation Skin:  Grossly intact  LABS:  BMET  Recent Labs Lab 12-04-15 1725  NA 142  K 3.8  CL 117*  CO2 18*  BUN 29*  CREATININE 1.58*  GLUCOSE 196*    Electrolytes  Recent Labs Lab Dec 04, 2015 1725 12/04/15 2022   CALCIUM 7.6*  --   MG  --  1.8  PHOS  --  3.7    CBC  Recent Labs Lab 12-04-2015 1725 11/17/15 0500  WBC 10.8* 3.2*  HGB 10.2* 10.5*  HCT 32.7* 34.8*  PLT 151 150    Coag's  Recent Labs Lab 04-Dec-2015 2022  APTT 30  INR 1.43    Sepsis Markers  Recent Labs Lab 04-Dec-2015 2022 12-04-2015 2348 11/17/15 0505  LATICACIDVEN 4.9* 4.0* 4.5*  PROCALCITON 4.04  --   --     ABG  Recent Labs Lab 04-Dec-2015 1738 11/17/15 0425  PHART 7.222* 7.123*  PCO2ART 45.4 47.5  PO2ART 289.0* 152*    Liver Enzymes  Recent Labs Lab 2015-12-04 1725  AST 25  ALT 13*  ALKPHOS 55  BILITOT 0.4  ALBUMIN 2.6*    Cardiac Enzymes No results for input(s): TROPONINI, PROBNP in the last 168 hours.  Glucose  Recent Labs Lab 12-04-2015 2000 2015/12/04 2341 11/17/15 0338  GLUCAP 174* 147* 150*    Imaging Ct Head Wo Contrast  Result Date: 12-04-15 CLINICAL DATA:  Altered mental status. EXAM: CT HEAD WITHOUT CONTRAST TECHNIQUE: Contiguous axial images were obtained from the base of the skull through the vertex without intravenous contrast. COMPARISON:  CT scan of January 17, 2010. FINDINGS: Bony calvarium appears intact. Mild diffuse cortical atrophy is noted. Right frontal and parietal encephalomalacia is noted consistent with old  infarctions. Left posterior parietal encephalomalacia is also noted consistent with old infarction. No mass effect or midline shift is noted. Ventricular size is within normal limits. There is no evidence of mass lesion, hemorrhage or acute infarction. IMPRESSION: Mild diffuse cortical atrophy. 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 No acute intracranial abnormality seen. Electronically Signed   By: Lupita RaiderJames  Green Jr, M.D.   On: 11/23/2015 19:21   Ct Chest W Contrast  Result Date: 11/26/2015 CLINICAL DATA:  Found down at nursing facility today. Aspiration, hypotensive. Followup severe aspiration pneumonia. History of diabetes, stroke and dementia. EXAM: CT CHEST, ABDOMEN, AND  PELVIS WITH CONTRAST TECHNIQUE: Multidetector CT imaging of the chest, abdomen and pelvis was performed following the standard protocol during bolus administration of intravenous contrast. CONTRAST:  75mL ISOVUE-300 IOPAMIDOL (ISOVUE-300) INJECTION 61% COMPARISON:  Chest radiograph November 16, 2015 at 1724 hours FINDINGS: CT CHEST FINDINGS CARDIOVASCULAR: Heart size is normal. Apical thinning LEFT ventricle. Trace pericardial effusion. Moderate calcific atherosclerosis of the thoracic aorta which is normal in course and caliber. MEDIASTINUM/NODES: No mediastinal mass. No lymphadenopathy by CT size criteria. Normal appearance of thoracic esophagus though not tailored for evaluation. LUNGS/PLEURA: Endotracheal tube tip just above the carina. Debris within the RIGHT lower lobe bronchi. Mild LEFT lower lobe bronchiectasis. Diffuse tree-in-bud infiltrates, and ground-glass centrilobular nodules with RIGHT lower lobe consolidation. No pleural effusion. No pneumothorax. MUSCULOSKELETAL: Heterogeneous thyroid gland without dominant nodule. Coarse calcifications LEFT breast. Osteopenia. Linear sclerosis RIGHT T8 rib, nonspecific and nonacute. CT ABDOMEN PELVIS FINDINGS HEPATOBILIARY: Liver and gallbladder are normal. PANCREAS: Normal. SPLEEN: Normal. ADRENALS/URINARY TRACT: Kidneys are orthotopic, demonstrating symmetric enhancement. No nephrolithiasis, hydronephrosis or solid renal masses. The unopacified ureters are normal in course and caliber. Delayed imaging through the kidneys without contrast excretion. Urinary bladder is partially distended and unremarkable. Normal adrenal glands. STOMACH/BOWEL: Stool distended rectum. Colonic air-fluid levels, fluid and small bowel. Nasogastric tube terminates in mid stomach. Colonic intra positioning. No bowel obstruction. VASCULAR/LYMPHATIC: Aortoiliac vessels are normal in course and caliber, a chronic calcified infrarenal aortic dissection. Moderate to severe calcific  atherosclerosis. 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 No lymphadenopathy by CT size criteria. REPRODUCTIVE: Normal. OTHER: No intraperitoneal free fluid or free air. MUSCULOSKELETAL: Nonacute. IMPRESSION: CT CHEST: Diffuse tree-in-bud infiltrates, ground-glass opacities in RIGHT lower lobe consolidation consistent with aspiration pneumonia. Debris in RIGHT lower lobe bronchi. Endotracheal tube tip just above the carina. CT ABDOMEN AND PELVIS: Stool distended rectum. Fluid in small and large bowel suggest enteritis without bowel obstruction. Nasogastric tube tip in mid stomach. 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 Electronically Signed   By: Awilda Metroourtnay  Bloomer M.D.   On: 11/27/2015 19:30   Ct Abdomen Pelvis W Contrast  Result Date: 12/03/2015 CLINICAL DATA:  Found down at nursing facility today. Aspiration, hypotensive. Followup severe aspiration pneumonia. History of diabetes, stroke and dementia. EXAM: CT CHEST, ABDOMEN, AND PELVIS WITH CONTRAST TECHNIQUE: Multidetector CT imaging of the chest, abdomen and pelvis was performed following the standard protocol during bolus administration of intravenous contrast. CONTRAST:  75mL ISOVUE-300 IOPAMIDOL (ISOVUE-300) INJECTION 61% COMPARISON:  Chest radiograph November 16, 2015 at 1724 hours FINDINGS: CT CHEST FINDINGS CARDIOVASCULAR: Heart size is normal. Apical thinning LEFT ventricle. Trace pericardial effusion. Moderate calcific atherosclerosis of the thoracic aorta which is normal in course and caliber. MEDIASTINUM/NODES: No mediastinal mass. No lymphadenopathy by CT size criteria. Normal appearance of thoracic esophagus though not tailored for evaluation. LUNGS/PLEURA: Endotracheal tube tip just above the carina. Debris within the RIGHT lower lobe bronchi.  Mild LEFT lower lobe bronchiectasis. Diffuse tree-in-bud infiltrates, and ground-glass centrilobular nodules with RIGHT lower lobe consolidation. No pleural  effusion. No pneumothorax. MUSCULOSKELETAL: Heterogeneous thyroid gland without dominant nodule. Coarse calcifications LEFT breast. Osteopenia. Linear sclerosis RIGHT T8 rib, nonspecific and nonacute. CT ABDOMEN PELVIS FINDINGS HEPATOBILIARY: Liver and gallbladder are normal. PANCREAS: Normal. SPLEEN: Normal. ADRENALS/URINARY TRACT: Kidneys are orthotopic, demonstrating symmetric enhancement. No nephrolithiasis, hydronephrosis or solid renal masses. The unopacified ureters are normal in course and caliber. Delayed imaging through the kidneys without contrast excretion. Urinary bladder is partially distended and unremarkable. Normal adrenal glands. STOMACH/BOWEL: Stool distended rectum. Colonic air-fluid levels, fluid and small bowel. Nasogastric tube terminates in mid stomach. Colonic intra positioning. No bowel obstruction. VASCULAR/LYMPHATIC: Aortoiliac vessels are normal in course and caliber, a chronic calcified infrarenal aortic dissection. Moderate to severe calcific atherosclerosis. 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 No lymphadenopathy by CT size criteria. REPRODUCTIVE: Normal. OTHER: No intraperitoneal free fluid or free air. MUSCULOSKELETAL: Nonacute. IMPRESSION: CT CHEST: Diffuse tree-in-bud infiltrates, ground-glass opacities in RIGHT lower lobe consolidation consistent with aspiration pneumonia. Debris in RIGHT lower lobe bronchi. Endotracheal tube tip just above the carina. CT ABDOMEN AND PELVIS: Stool distended rectum. Fluid in small and large bowel suggest enteritis without bowel obstruction. Nasogastric tube tip in mid stomach. 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 Electronically Signed   By: Awilda Metro M.D.   On: 11/12/2015 19:30   Dg Chest Port 1 View  Result Date: 11/17/2015 CLINICAL DATA:  Endotracheal tube check.  Acute respiratory failure. EXAM: PORTABLE CHEST 1 VIEW COMPARISON:  Chest radiograph 12/02/2015 and chest CT 12/07/2015  FINDINGS: Support apparatus is unchanged. There is improved aeration at the right lung base. Patchy parenchymal opacities in the left lung have worsened from the prior study. No pneumothorax or sizable pleural effusion. Cardiomediastinal contours are unchanged. IMPRESSION: 1. Worsening opacities within the left lung, possibly indicating pulmonary edema versus progression of aspiration pneumonitis. 2. Improved aeration of the right lung base. Electronically Signed   By: Deatra Robinson M.D.   On: 11/17/2015 06:21   Dg Chest Portable 1 View  Result Date: 12/10/2015 CLINICAL DATA:  Endotracheal tube placement. Unresponsive patient. Altered mental status. EXAM: PORTABLE CHEST 1 VIEW COMPARISON:  06/27/2015 FINDINGS: Endotracheal tube tip 2.5 cm above the carina. Nasogastric tube enters the stomach. Worsened volume loss/ atelectasis involving the right middle lobe, a component of superimposed right middle lobe pneumonia is difficult to exclude. Degenerative glenohumeral arthropathy. Indistinct linear opacities in the left perihilar region and left lung base. These are increased from prior. Elevated right hemidiaphragm. IMPRESSION: 1. Airspace opacity primarily in the right middle lobe with elevated right hemidiaphragm. Appearance favors atelectasis although a component of pneumonia in the right middle lobe is not excluded. 2. ET tube well positioned, 2.5 cm above the carina. 3. Linear perihilar and basilar opacities on the left, bronchopneumonia not excluded, new compared to April 2017. 4. Tortuous and atherosclerotic thoracic aorta. Electronically Signed   By: Gaylyn Rong M.D.   On: 12/05/2015 17:39     STUDIES:  CT head 9/5 > neg for acute abnormalities  CT chest 9/5 > Diffuse tree-in-bud infiltrates, ground-glass opacities in RIGHT lower lobe consolidation consistent with aspiration pneumonia. Debris in RIGHT lower lobe bronchi. CT abdomen/pelvis 9/5 > Stool distended rectum. Fluid in small and large  bowel suggest enteritis without bowel obstruction. Nasogastric tube tip in mid stomach. 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  CULTURES: BCx2 9/5 > Tracheal  aspirate 9/5 > Urine CX 9/5  ANTIBIOTICS: Zosyn 9/5 > Vancomycin 9/5 >  SIGNIFICANT EVENTS: 9/6- shock, levophed high, line placed  LINES/TUBES: ETT 9/5 > Left IJ 9/6>>>  DISCUSSION: 75 year old female who resides in SNF found down. Intubated in ICU. Admit for aspiration PNA. Treat with zosyn and vanco. Maintain on vent. BP improved with volume. Monitor closely.    ASSESSMENT / PLAN:  PULMONARY A: Acute hypoxemic respiratory failure secondary to aspiration PNA Aspiration ARDS P:   Full vent support Follow ABG now after rate increase Drop O2 need based on O2 pao2 pCXR in AM Vent bundle Antibiotics as below  Keep plate less 30   CARDIOVASCULAR A:  Hypotension, septic shock / SIRS Hx HLD, HTN Rel AI P:  Telemetry monitoring MAP goal > Levophed drip Central line placed, get cvp Trend lactic acid Get cortisol, then stress roids  RENAL A:   AKI, likely volume depletion  CK normal, no rhabdo NONAG P:   Follow BMP Replete lytes as needed Pending chem now Get cvp Bicarb for NONAG, not for la Avoid saline further with NONAG unless for bolus  GASTROINTESTINAL A:   GERD R/o cdiff At risk dead bowel P:   NPO for now Pepcid for SUP Neg cdiff Lactic noted, assess ldh Ct was done without contrast to note Volume resus  HEMATOLOGIC A:   Anemia, mild (at baseline) Mild Leukopenia- acute illness  P:  Add SQ heparin for DVT ppx, head CT clear  Follow CBC  INFECTIOUS A:   Aspiration pneumonia R./o gastroenteritis P:   Continue Vanc and Zosyn  Follow PCT Cultures Send GI panel  ENDOCRINE A:   DM Rel AI P:   Hold home lantus/insulin CBG monitoring and SSI Stress roids  NEUROLOGIC A:   Acute metabolic encephalopathy Vascular dementia Hx CVA  P:   RASS  goal: -1 to -2 Fentanyl gtt, off now Versed PRN - dc Holding home aricept, namenda, zoloft  FAMILY  - Updates: calling med poa to discuss code status   - Inter-disciplinary family meet or Palliative Care meeting due by:  9/12  Rich Number, MD, MPH Internal Medicine Resident, PGY-III Pager: [phone]  STAFF NOTE: Cindi Carbon, MD FACP have personally reviewed patient's available data, including medical history, events of note, physical examination and test results as part of my evaluation. I have discussed with resident/NP and other care providers such as pharmacist, RN and RRT. In addition, I personally evaluated patient and elicited key findings of: not following command, coarse BS, ARDS, asp pna, r/o abdominal process resulting in bowel ischemia, abdo exam no r/g, soft , ild distention,  MODS, refractory shock, stat  Line, will cal family to discuss heroics, CT done but without contrast, may need repeat, assess LActic further,  Ldh, increase MV vent repeat abg, place a line likely, maintain current nosocomial coverage, increase peep to reduce fio2 to goal 60%, rate increase needed, NONAG prior, repeat chem pending, bicarb fo rNONAG , prognosis poor, concern for survival, stress roids indicated The patient is critically ill with multiple organ systems failure and requires high complexity decision making for assessment and support, frequent evaluation and titration of therapies, application of advanced monitoring technologies and extensive interpretation of multiple databases.   Critical Care Time devoted to patient care services described in this note is 45 Minutes. This time reflects time of care of this signee: Rory Percy, MD FACP. This critical care time does not reflect procedure time, or teaching time  or supervisory time of PA/NP/Med student/Med Resident etc but could involve care discussion time. Rest per NP/medical resident whose note is outlined above and that I agree  with   Mcarthur Rossetti. Tyson Alias, MD, FACP Pgr: [phone] [facility] 11/17/2015 10:39 AM   I have had extensive discussions with family med poa sister. We discussed patients current circumstances and organ failures. We also discussed patient's prior wishes under circumstances such as this. Family has decided to NOT perform resuscitation if arrest but to continue current medical support for now. Also no surgery option  Mcarthur Rossetti. Tyson Alias, MD, FACP Pgr: [phone] [facility]

## 2015-11-17 NOTE — Progress Notes (Addendum)
At 0330 Pt with decreased oxygen sat on vent; 86-88%. Pt hypotensive and no longer responding to pain. Dr. Sung AmabileSimonds paged and made aware. Respiratory at bedside. New orders received.   Roselie AwkwardMegan Ily Denno, RN

## 2015-11-17 NOTE — Progress Notes (Signed)
Pt on 25mcg Levo; BP continues to drop. Dr. Sung AmabileSimonds paged and made aware. New order received for 500cc NS Bolus.RN followed orders. Dr. Sung AmabileSimonds suggest a central line placement; awaiting order.   Roselie AwkwardMegan Rami Budhu, RN

## 2015-11-17 NOTE — Progress Notes (Signed)
PHARMACY - PHYSICIAN COMMUNICATION CRITICAL VALUE ALERT - BLOOD CULTURE IDENTIFICATION (BCID)  No results found for this or any previous visit.  Micro lab called with gram + cocci in clusters on gram stain, but BCID invalid. BICD repeated and still invalid.  So, BCID is credited and results do not display.  Name of physician (or Provider) Contacted: Dr. Arsenio LoaderSommer  Changes to prescribed antibiotics required:  No changes. Continue Vancomycin and Zosyn.  Dennie Fettersgan, Tykisha Areola Donovan, [place] Pager: [phone] 11/17/2015  8:07 PM

## 2015-11-17 NOTE — Progress Notes (Signed)
Inpatient Diabetes Program Recommendations  AACE/ADA: New Consensus Statement on Inpatient Glycemic Control (2015)  Target Ranges:  Prepandial:   less than 140 mg/dL      Peak postprandial:   less than 180 mg/dL (1-2 hours)      Critically ill patients:  140 - 180 mg/dL     Review of Glycemic Control  Diabetes history: DM Outpatient Diabetes medications: Lantus 20 units qd +Humalog 0-5 units tid Current orders for Inpatient glycemic control: Novolog correction 2-6 units q 4 hrs.  Inpatient Diabetes Program Recommendations:   Please consider ICU Glycemic Control Orders while pt. Is on ventilator.  If not started on ICU, please consider starting on basal insulin Lantus 10 units qd (50% of home basal dose).  Thank you, Billy FischerJudy E. Jearldean Gutt, RN, MSN, CDE Inpatient Glycemic Control Team Team Pager [phone] (8am-5pm) 11/17/2015 2:25 PM

## 2015-11-17 NOTE — Procedures (Addendum)
Central Venous Catheter Insertion Procedure Note [name] [name] [id number] 05/04/1940  Procedure: Insertion of Central Venous Catheter Indications: Assessment of intravascular volume, Drug and/or fluid administration and Frequent blood sampling  Procedure Details Consent: Risks of procedure as well as the alternatives and risks of each were explained to the (patient/caregiver).  Consent for procedure obtained. Time Out: Verified patient identification, verified procedure, site/side was marked, verified correct patient position, special equipment/implants available, medications/allergies/relevent history reviewed, required imaging and test results available.  Performed  Maximum sterile technique was used including antiseptics, cap, gloves, gown, hand hygiene, mask and sheet. Skin prep: Chlorhexidine; local anesthetic administered A antimicrobial bonded/coated triple lumen catheter was placed in the left internal jugular vein using the Seldinger technique.  Evaluation Blood flow good Complications: No apparent complications Patient did tolerate procedure well. Chest X-ray ordered to verify placement.  CXR: pending.  [name] [name],[name] Colter J. 11/17/2015, 9:12 AM  US  Mcarthur Rossettianiel J. Tyson AliasFeinstein, MD, FACP Pgr: [phone] [place]

## 2015-11-17 NOTE — Care Management Note (Signed)
Case Management Note  Patient Details  Name: [name] [name] MRN: [id number] Date of Birth: 12/03/1940  Subjective/Objective:   Pt admitted for acute resp failure                 Action/Plan:  PTA from [facility].  CSW consulted for discharge planning.  CSW actively following up on that pt was "down" and not seen for "days" at the facility.   Expected Discharge Date:                  Expected Discharge Plan:  Skilled Nursing Facility  In-House Referral:  Clinical Social Work  Discharge planning Services  CM Consult  Post Acute Care Choice:    Choice offered to:     DME Arranged:    DME Agency:     HH Arranged:    HH Agency:     Status of Service:  In process, will continue to follow  If discussed at Long Length of Stay Meetings, dates discussed:    Additional Comments:  Cherylann ParrClaxton, Deionte Spivack S, RN 11/17/2015, 2:54 PM

## 2015-11-18 DIAGNOSIS — R112 Nausea with vomiting, unspecified: Secondary | ICD-10-CM

## 2015-11-18 DIAGNOSIS — R1110 Vomiting, unspecified: Secondary | ICD-10-CM

## 2015-11-18 LAB — BASIC METABOLIC PANEL
Anion gap: 10 (ref 5–15)
BUN: 38 mg/dL — AB (ref 6–20)
CALCIUM: 7.1 mg/dL — AB (ref 8.9–10.3)
CO2: 20 mmol/L — ABNORMAL LOW (ref 22–32)
Chloride: 109 mmol/L (ref 101–111)
Creatinine, Ser: 2.4 mg/dL — ABNORMAL HIGH (ref 0.44–1.00)
GFR calc Af Amer: 22 mL/min — ABNORMAL LOW (ref >60)
GFR, EST NON AFRICAN AMERICAN: 19 mL/min — AB (ref >60)
GLUCOSE: 219 mg/dL — AB (ref 65–99)
Potassium: 4.5 mmol/L (ref 3.5–5.1)
Sodium: 139 mmol/L (ref 135–145)

## 2015-11-18 LAB — GASTROINTESTINAL PANEL BY PCR, STOOL (REPLACES STOOL CULTURE)
Adenovirus F40/41: NOT DETECTED
Astrovirus: NOT DETECTED
CYCLOSPORA CAYETANENSIS: NOT DETECTED
Campylobacter species: NOT DETECTED
Cryptosporidium: NOT DETECTED
E. COLI O157: NOT DETECTED
ENTAMOEBA HISTOLYTICA: NOT DETECTED
Enteroaggregative E coli (EAEC): NOT DETECTED
Enteropathogenic E coli (EPEC): NOT DETECTED
Enterotoxigenic E coli (ETEC): NOT DETECTED
Giardia lamblia: NOT DETECTED
NOROVIRUS GI/GII: NOT DETECTED
Plesimonas shigelloides: NOT DETECTED
Rotavirus A: NOT DETECTED
SALMONELLA SPECIES: NOT DETECTED
SAPOVIRUS (I, II, IV, AND V): NOT DETECTED
SHIGELLA/ENTEROINVASIVE E COLI (EIEC): NOT DETECTED
Shiga like toxin producing E coli (STEC): NOT DETECTED
VIBRIO CHOLERAE: NOT DETECTED
Vibrio species: NOT DETECTED
Yersinia enterocolitica: NOT DETECTED

## 2015-11-18 LAB — GLUCOSE, CAPILLARY
GLUCOSE-CAPILLARY: 147 mg/dL — AB (ref 65–99)
GLUCOSE-CAPILLARY: 202 mg/dL — AB (ref 65–99)

## 2015-11-18 LAB — URINE CULTURE: Culture: NO GROWTH

## 2015-11-18 LAB — PHOSPHORUS: Phosphorus: 4.5 mg/dL (ref 2.5–4.6)

## 2015-11-18 LAB — MAGNESIUM: Magnesium: 2 mg/dL (ref 1.7–2.4)

## 2015-11-18 MED ORDER — MORPHINE SULFATE 25 MG/ML IV SOLN
10.0000 mg/h | INTRAVENOUS | Status: DC
  Administered 2015-11-18: 2 mg/h via INTRAVENOUS
  Filled 2015-11-18 (×2): qty 10

## 2015-11-18 MED ORDER — MORPHINE BOLUS VIA INFUSION
5.0000 mg | INTRAVENOUS | Status: DC | PRN
  Filled 2015-11-18 (×2): qty 20

## 2015-11-18 NOTE — Consult Note (Signed)
CH spoke with staff re pt extubation.  Family not present currently and extubation likely for 9/8.  Please page CH to support family as needed. 1:49 PM Erline LevineMichael I Emmette Katt

## 2015-11-18 NOTE — Progress Notes (Signed)
Nutrition Brief Note  Chart reviewed. Patient now transitioning to comfort care.  No nutrition interventions warranted at this time.  Please consult as needed.   Kimberly Harris, RD, LDN, CNSC Pager [phone] After Hours Pager [phone]    

## 2015-11-18 NOTE — Progress Notes (Addendum)
PULMONARY / CRITICAL CARE MEDICINE   Name: [name] [name] M Huge MRN: [id number] DOB: 08/12/1940    ADMISSION DATE:  11/18/2015 CONSULTATION DATE:  12/04/2015  REFERRING MD:  Dr. Patria Maneampos EDP  CHIEF COMPLAINT:  Unresponsive  HISTORY OF PRESENT ILLNESS:   75 year old female with PMH as below, which is significant for DM, HSV, and dementia. [facility] she had not been seen for a few days. When staff checked on her 9/5 she was found down and was unresponsive. EMS was called and she was transported to ED. She was intubated for airway protection and was noted to have vomitus on vocal cords, which was also aspirated from ETT. Initially she was hypotensive in ED, which has since improved with IVF resuscitation. CXR consistent with severe aspiration pneumonia. PCCM to admit.  SUBJECTIVE: refractory shock on max levo, min urine output   VITAL SIGNS: BP (!) 76/48   Pulse 91   Temp 98.3 F (36.8 C) (Oral)   Resp (!) 35   Ht 5\' 7"  (1.702 m)   Wt 79.4 kg (175 lb 0.7 oz)   SpO2 97%   BMI 27.42 kg/m   HEMODYNAMICS: CVP:  [11 mmHg-18 mmHg] 11 mmHg  VENTILATOR SETTINGS: Vent Mode: PRVC FiO2 (%):  [40 %-100 %] 40 % Set Rate:  [35 bmp] 35 bmp Vt Set:  [370 mL-420 mL] 370 mL PEEP:  [12 cmH20] 12 cmH20 Plateau Pressure:  [24 cmH20-27 cmH20] 25 cmH20  INTAKE / OUTPUT: I/O last 3 completed shifts: In: 5016 [I.V.:3036; Other:30; IV Piggyback:1950] Out: 825 [Urine:310; Emesis/NG output:45; Stool:470]  PHYSICAL EXAMINATION: General:  Intubated, non-purposeful movement of all 4 extremities Neuro:  Agitation resolved, deep rass on own -3 to -4 HEENT:  [place] I/AT, PERRL, no JVD Cardiovascular:  RRR, no MRG Lungs:  Coarse  Abdomen:  Soft, non-tender, non-distended, no r/g, obese Musculoskeletal:  Mild edema Skin:  Grossly intact  LABS:  BMET  Recent Labs Lab 12/05/2015 1725 11/17/15 1009 11/18/15 0530  NA 142 142 139  K 3.8 3.5 4.5  CL 117* 118* 109  CO2 18* 17* 20*   BUN 29* 32* 38*  CREATININE 1.58* 1.88* 2.40*  GLUCOSE 196* 246* 219*    Electrolytes  Recent Labs Lab 11/21/2015 1725 11/19/2015 2022 11/17/15 1009 11/18/15 0530  CALCIUM 7.6*  --  7.1* 7.1*  MG  --  1.8 1.4* 2.0  PHOS  --  3.7 2.7 4.5    CBC  Recent Labs Lab 12/01/2015 1725 11/17/15 0500  WBC 10.8* 3.2*  HGB 10.2* 10.5*  HCT 32.7* 34.8*  PLT 151 150    Coag's  Recent Labs Lab 11/19/2015 2022  APTT 30  INR 1.43    Sepsis Markers  Recent Labs Lab 12/04/2015 2022 11/12/2015 2348 11/17/15 0505 11/17/15 0812  LATICACIDVEN 4.9* 4.0* 4.5* 3.4*  PROCALCITON 4.04  --   --   --     ABG  Recent Labs Lab 11/27/2015 1738 11/17/15 0425 11/17/15 1200  PHART 7.222* 7.123* 7.180*  PCO2ART 45.4 47.5 38.7  PO2ART 289.0* 152* 429.0*    Liver Enzymes  Recent Labs Lab 12/06/2015 1725  AST 25  ALT 13*  ALKPHOS 55  BILITOT 0.4  ALBUMIN 2.6*    Cardiac Enzymes No results for input(s): TROPONINI, PROBNP in the last 168 hours.  Glucose  Recent Labs Lab 11/17/15 1134 11/17/15 1614 11/17/15 2031 11/17/15 2324 11/18/15 0356 11/18/15 0712  GLUCAP 233* 181* 146* 155* 147* 202*    Imaging No  results found.   STUDIES:  CT head 9/5 > neg for acute abnormalities  CT chest 9/5 > Diffuse tree-in-bud infiltrates, ground-glass opacities in RIGHT lower lobe consolidation consistent with aspiration pneumonia. Debris in RIGHT lower lobe bronchi. CT abdomen/pelvis 9/5 > Stool distended rectum. Fluid in small and large bowel suggest enteritis without bowel obstruction. Nasogastric tube tip in mid stomach. 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  CULTURES: BCx2 9/5 > 1/2 GPC clusters>>> Tracheal aspirate 9/5 > Urine CX 9/5  ANTIBIOTICS: Zosyn 9/5 >>> Vancomycin 9/5 >>>  SIGNIFICANT EVENTS: 9/6- shock, levophed high, line placed 9/7- actively dying  LINES/TUBES: ETT 9/5 > Left IJ 9/6>>>  DISCUSSION: 75 year old female who resides in SNF  found down. Intubated in ICU. Admit for aspiration PNA. Treat with zosyn and vanco. Maintain on vent. BP improved with volume. Monitor closely.    ASSESSMENT / PLAN:  PULMONARY A: Acute hypoxemic respiratory failure secondary to aspiration PNA Aspiration ARDS P:   ABG reviewed, keep plat less 30  Keep high MV needs NO SBT able likely can reduce peep, see last poa2  CARDIOVASCULAR A:  Hypotension, septic shock / SIRS - refractory shock Hx HLD, HTN Rel AI P:  Telemetry monitoring MAP goal > Levophed drip is at max No vaso with risk ishcmia bowel stress roids cvp 11  RENAL A:   AKI, ATN, ARF NONAG improved P:   Follow BMP NOt a candidate for HD and family not wish heroics Bicarb for NONAG  GASTROINTESTINAL A:   GERD R/o cdiff At risk dead bowel P:   NPO for now Pepcid for SUP Suction tube No feeds ldh re assuring  HEMATOLOGIC A:   Anemia, mild (at baseline) Mild Leukopenia- acute illness  P:  SQ heparin  INFECTIOUS A:   Aspiration pneumonia R./o gastroenteritis Likely BC contamination  P:   Continue Vanc and Zosyn - dose adjust Follow PCT Cultures Send GI panel - pending  ENDOCRINE A:   DM Rel AI Controlled glu P:   CBG monitoring and SSI Stress roids  NEUROLOGIC A:   Acute metabolic encephalopathy Vascular dementia Hx CVA  P:   RASS goal: -1 Prn fent Will call family sister family to consider comfort care  Holding home aricept, namenda, zoloft  FAMILY  - Updates: calling med poa again today  - Inter-disciplinary family meet or Palliative Care meeting due by:  9/12  Ccm time 35 min   Mcarthur Rossetti. Tyson Alias, MD, FACP Pgr: [phone]  Pulmonary & Critical Care   Extensive discussion with family sister med poa. We discussed the poor prognosis and likely poor quality of life. Family has decided to offer full comfort care. They are aware that the patient may be transferred to palliative care floor for continued  comfort care needs. They have been fully updated on the process and expectations. We will dc vent and levo at 9 am when she arrives  Mcarthur Rossetti. Tyson Alias, MD, FACP Pgr: [phone]  Pulmonary & Critical Care

## 2015-11-19 LAB — CULTURE, BLOOD (ROUTINE X 2)

## 2015-11-19 NOTE — Progress Notes (Signed)
Pt received to 6n24, sister at bedside

## 2015-11-19 NOTE — Progress Notes (Signed)
   11/19/15 0905  Clinical Encounter Type  Visited With Patient and family together  Visit Type Initial  Referral From Nurse  Spiritual Encounters  Spiritual Needs Prayer (CH offered prayer of hope and comfort to sister of Pt.)  Stress Factors  Patient Stress Factors Not reviewed  Family Stress Factors Exhausted;Major life changes  Ch responded to page to be on hand for extubation of Pt. Pts sister present and prayer of hope was offered during visit. After extubation CH offered any other needs for sister. Sister at peace.

## 2015-11-19 NOTE — Progress Notes (Signed)
PULMONARY / CRITICAL CARE MEDICINE   Name: [name] [name] MRN: [id number] DOB: 05/09/1940    ADMISSION DATE:  12/06/2015 CONSULTATION DATE:  12/04/2015  REFERRING MD:  Dr. Patria Maneampos EDP  CHIEF COMPLAINT:  Unresponsive  HISTORY OF PRESENT ILLNESS:   75 year old female with PMH as below, which is significant for DM, HSV, and dementia. [facility] she had not been seen for a few days. When staff checked on her 9/5 she was found down and was unresponsive. EMS was called and she was transported to ED. She was intubated for airway protection and was noted to have vomitus on vocal cords, which was also aspirated from ETT. Initially she was hypotensive in ED, which has since improved with IVF resuscitation. CXR consistent with severe aspiration pneumonia. PCCM to admit.  SUBJECTIVE: hypotense Morphine started Levo weaned off Low output   VITAL SIGNS: BP (!) 157/50   Pulse 66   Temp 98.3 F (36.8 C) (Oral)   Resp (!) 8   Ht 5\' 7"  (1.702 m)   Wt 77.3 kg (170 lb 6.7 oz)   SpO2 100%   BMI 26.69 kg/m   HEMODYNAMICS:    VENTILATOR SETTINGS: Vent Mode: PRVC FiO2 (%):  [40 %] 40 % Set Rate:  [35 bmp] 35 bmp Vt Set:  [370 mL] 370 mL PEEP:  [12 cmH20] 12 cmH20 Plateau Pressure:  [22 cmH20-25 cmH20] 24 cmH20  INTAKE / OUTPUT: I/O last 3 completed shifts: In: 2108.2 [I.V.:1858.2; IV Piggyback:250] Out: 400 [Urine:275; Emesis/NG output:50; Stool:75]  PHYSICAL EXAMINATION: General:  Intubated, non-purposeful movement of all 4 extremities Neuro:  Not following commands HEENT:  perrl Cardiovascular:  RRR, no MRG Lungs:  Coarse moderate Abdomen:  Soft, non-tender, non-distended Musculoskeletal:  Edema present Skin:  No rash  LABS:  BMET  Recent Labs Lab 11/13/2015 1725 11/17/15 1009 11/18/15 0530  NA 142 142 139  K 3.8 3.5 4.5  CL 117* 118* 109  CO2 18* 17* 20*  BUN 29* 32* 38*  CREATININE 1.58* 1.88* 2.40*  GLUCOSE 196* 246* 219*     Electrolytes  Recent Labs Lab 11/15/2015 1725 11/26/2015 2022 11/17/15 1009 11/18/15 0530  CALCIUM 7.6*  --  7.1* 7.1*  MG  --  1.8 1.4* 2.0  PHOS  --  3.7 2.7 4.5    CBC  Recent Labs Lab 12/04/2015 1725 11/17/15 0500  WBC 10.8* 3.2*  HGB 10.2* 10.5*  HCT 32.7* 34.8*  PLT 151 150    Coag's  Recent Labs Lab 11/12/2015 2022  APTT 30  INR 1.43    Sepsis Markers  Recent Labs Lab 11/18/2015 2022 12/07/2015 2348 11/17/15 0505 11/17/15 0812  LATICACIDVEN 4.9* 4.0* 4.5* 3.4*  PROCALCITON 4.04  --   --   --     ABG  Recent Labs Lab 12/08/2015 1738 11/17/15 0425 11/17/15 1200  PHART 7.222* 7.123* 7.180*  PCO2ART 45.4 47.5 38.7  PO2ART 289.0* 152* 429.0*    Liver Enzymes  Recent Labs Lab 11/21/2015 1725  AST 25  ALT 13*  ALKPHOS 55  BILITOT 0.4  ALBUMIN 2.6*    Cardiac Enzymes No results for input(s): TROPONINI, PROBNP in the last 168 hours.  Glucose  Recent Labs Lab 11/17/15 1134 11/17/15 1614 11/17/15 2031 11/17/15 2324 11/18/15 0356 11/18/15 0712  GLUCAP 233* 181* 146* 155* 147* 202*    Imaging No results found.   STUDIES:  CT head 9/5 > neg for acute abnormalities  CT chest 9/5 > Diffuse  tree-in-bud infiltrates, ground-glass opacities in RIGHT lower lobe consolidation consistent with aspiration pneumonia. Debris in RIGHT lower lobe bronchi. CT abdomen/pelvis 9/5 > Stool distended rectum. Fluid in small and large bowel suggest enteritis without bowel obstruction. Nasogastric tube tip in mid stomach. 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  CULTURES: BCx2 9/5 > Tracheal aspirate 9/5 > Urine CX 9/5  ANTIBIOTICS: Zosyn 9/5 >off Vancomycin 9/5 >off  SIGNIFICANT EVENTS: 9/6- shock, levophed high, line placed  LINES/TUBES: ETT 9/5 > Left IJ 9/6>>>  DISCUSSION: 75 year old female who resides in SNF found down. Intubated in ICU. Admit for aspiration PNA. Treat with zosyn and vanco. Maintain on vent. BP improved  with volume. Monitor closely.    ASSESSMENT / PLAN:  PULMONARY A: Acute hypoxemic respiratory failure secondary to aspiration PNA Aspiration ARDS P:   For comfort care Ensure rate 12 Perform 30 sec weaning cpa 5 ps 5, assess tv and rate and titrate morphine to rr 12-18 and comfort care No pcxr needed  CARDIOVASCULAR A:  Hypotension, septic shock / SIRS Hx HLD, HTN Rel AI P:  Telemetry monitoring Levophed to off noted No role restart  RENAL A:   AKI, likely volume depletion  CK normal, no rhabdo NONAG P:   ARF, 20 cc output No role further therapy   GASTROINTESTINAL A:   GERD R/o cdiff At risk dead bowel P:   NPO  Dc TF done  HEMATOLOGIC A:   Anemia, mild (at baseline) Mild Leukopenia- acute illness  P:  Dc labs  INFECTIOUS A:   Aspiration pneumonia R./o gastroenteritis P:   Treat fevers  ENDOCRINE A:   DM Rel AI P:   Dc cbg Stress roids- dc  NEUROLOGIC A:   Acute metabolic encephalopathy Vascular dementia Hx CVA Comfort care planned P:   morp0hine drip required See resp fopr titration I am doing actively now  FAMILY  - Updates: await med poa to bedside now at 9 am , will update  - Inter-disciplinary family meet or Palliative Care meeting due by:  9/12  Ccm time 30 min   Mcarthur Rossetti. Tyson Alias, MD, FACP Pgr: [phone] [place] Pulmonary & Critical Care

## 2015-11-19 NOTE — Procedures (Signed)
Extubation Procedure Note  Patient Details:   Name: [name] [name] DOB: 08/08/1940 MRN: [id number]   Airway Documentation:  Airway 7.5 mm (Active)  Secured at (cm) 24 cm 11/19/2015  8:51 AM  Measured From Lips 11/19/2015  8:51 AM  Secured Location Center 11/19/2015  8:51 AM  Secured By [employer] Tube Holder 11/19/2015  8:51 AM  Tube Holder Repositioned Yes 11/19/2015  8:51 AM  Cuff Pressure (cm H2O) 24 cm H2O 11/18/2015  4:50 PM  Site Condition Dry 11/19/2015  8:51 AM  Pt extubated to room air.   Evaluation  O2 sats: currently acceptable Complications: No apparent complications Patient did tolerate procedure well. Bilateral Breath Sounds: Clear   No  [name] [name] 11/19/2015, 9:56 AM

## 2015-11-21 LAB — CULTURE, BLOOD (ROUTINE X 2): Culture: NO GROWTH

## 2015-11-25 ENCOUNTER — Telehealth: Payer: Self-pay

## 2015-11-26 NOTE — Telephone Encounter (Signed)
[facility] 11/25/2015 [facility](Dennis BastJoyce Johnson dropped it off) (burial). The death certificate will be taken to Pulmonary Unit @ [facility] for signature. On 11/26/2015 I received the death certificate back from Doctor Young. I got the death certificate ready and called Dennis BastJoyce Johnson to let her know the death certificate is ready for pickup.

## 2015-12-12 NOTE — Progress Notes (Addendum)
Pt is unresponsive, mouth breathing. Audible rhonchi and coarse crackles noted, oropharyngeal suctioning done. Pt's sister and more family members at the room now. Bed bath done today, repositioned pt.  1845 Pt not breathing, no V/S. Time of death 1845. Pronounced dead by 2 Rns, Naif Alabi RN and Vassie Cain Rn. Dr Sommer notified. Pt's sister at the bedside at time of death.  1900 Morphine 41mAlb10/20/08/FrJVass07-Aug10/25/June 2017-08-ApKoreaSurgcenter September 20, 2024514 224182Mi2024-10-PrincesMarlyne 14-Nov-20Nena0922m-Alb10-20-August 10,FrankyNorvJVassAug 07,10/25/2SeptembJune 2017/Apr 08[facility]orea[facility]09-20-24616-09(226Mi04-Oct-20PrincesMarlyne 11-14-(26Nena27-Se44mpAlb2024/108/10FrankyNorvJVass2024-2024-10-2Septemb16-2017-2019[facility] H09-20-24425-74(71911Mi10/04/PrincesMarlyne 2024/11/NenaSep 2724m,Alb10-20-2015-FrankyNorvJVassAugust 07,2024-120September 28,Apr 08[facility]orea[facility]2024/09/20203-26(352Nov 0AMiOctober 04, 20PrincesMarlyne 11-14-2026NenaSeptember 2789m,AlbOctober 20, 08/10FrankyNJVass07-Aug10-25-2016-Sep 28,2019Korea[facility] CenSeptember 20, 2024304-685641Mi2024-10-PrincesMarlyne Nov 14, 20(863Nena0947m/Alb10-210-AugFrankyNorJVass2024-10-25-202009/282019Korea[facility] H2024/09/209093(21703-Mi10-04-PrincesMarlyne 11/14/(564Nena09-264m7Alb10/208/10FrankyNoJVass2024-2024/12/2007-2804[facility]09/20/20249161(8142Mi04-Oct-20PrincesMarlyne 11/14/81NenaSeptember 2753m,Alb10-2Aug 10,FrankyNorvilJVass08/0710/25/202017-2019[facility] 2024-09-20667-3320Mi10/04/PrincesMarlyne 2024-11-(937Nena09-256m7Alb10/20/2015/FrankyJVass07-Aug25-Oct-2009-04[facility]orea[facility] H2024/09/20351-39340NMi10/04/20PrincesMarlyne 2024/11/(20Nena27-SepAlb202Aug 0706/16/April 08[place]orea, (623)03(2MicMarlyne B21527-Se20mpAlbOct 20, 08/10FrankyNoJVass08/0710-25-202S062017/2019KoreaOcr [facility]vel[facility] SurgerySep 20, 2024972-02618Mi2024-10-PrincesMarlyne 2024-11-66NenaSeptember 2742m,Alb10-20-Aug 10,FrankyNorvillJulJVass07-Aug10-25-202Septemb2009/2019Korea[facility] Cen09-20-24415-602281MiOct 04, 20PrincesMarlyne 11/14/73Nena0966m/Alb10/210-AugFrankyJVass08-0725-Oct-20Jun 2017-2019KoreaS[facility] Cen09-20-2024579-72(4121Mi04-Oct-20PrincesMarlyne 11-14-65Nena27-Se5mpAlbOct 20, 2015/FrankyNorvill2JVass08-07October 25, 0609-2804/0Korea[facility]2024/09/20815-1321720Mi10-04-20PrincesMarlyne 14-Nov-2025Nena201737m/Alb2024/108/10FrankyNoJVass08-10-25-Jun 09-282019KoreaWashington [facility] Cen09-20-2024(702)22711-AugMi10-04-20PrincesMarlyne 2024-11-Nena091m-Alb10-20-2015-FrankyNoJVass07-Aug25-Oct-202S0609-April 08[facility]orea[facility]en09-20-2024706-69(787)Nov 015-AMiOct 04, 20PrincesMarlyne Nov 14, 2071Nena09-218m7Alb20-Oct-08-10FrankyNorvJVass2024-2024-12-2007/2804Korea[facility] Cen2024-09-2021737862MiOct 04, 20PrincesMarlyne 11-14-77Nena0960m/Alb20-Oct-08-10FrankyNorvJVassAug 07,10/25/20Jun 2017/04KoreaR[facility]20-Sep-2024458-01(618MiOct 04, 20PrincesMarlyne November 14, 2077Nena0971m-AlbOct 20, 2015/FranJVassAugust 07,10/25/2Sep 28,Apr 08KoreaWh[facility] MedicalSeptember 20, 2024(787)17614NovMiOct 04, 20PrincesMarlyne November 14, 2056NenaSeptember 2746m,AlbOct 20, 2015/FrankyNorvill20JVass2024-10/25/Jun 09-28April 08[facility] December 01, 2022(640)41(Mi10/04/PrincesMarlyne 11-14-31Nena0922m-Alb20-Oct-08-FrankyNoJVass2024/Oct 25, 2006Sep 28,Apr 08[facility]orea[facility]09-20-24(301)0927203Mi10/04/20PrincesMarlyne 2024-11-(757Nena09-22m7AlbOct 20, 2015/FrankyNoJVass08/2024/12/2007-April 08[facility]en09/20/2024617-65901Nov 015-AMiOct 04, 20PrincesMarlyne 14-Nov-2022Nena09-231m7Alb10-2August 10,FrankyNorJVass08/10/25/202062017/2019KoreaFrank[facility] HSeptember 20, 2024(236)20985MiOctober 04, 20PrincesMarlyne 11/14/2078Nena09-242m7Alb2024/108-FrankyJVass08/10/25/20June 11-2802/0Korea[facility] CenSeptember 20, 2024386-0683Mi10/04/PrincesMarlyne 11-14-2058Nena201798m/AlbOctober 20, 2015-FrankyNorvill0JVass08/01/05/2015-Sep 28,04Korea[facility]09/20/2024(913)0740111/August 1Mi10-04-PrincesMarlyne 14-Nov-2041Nena201768m-Alb10/22015/FrankyNorvill2JVass2024/October 25, 2020Sep 28,08-ApKoreaEndoscopy Center Of ToSep 20, 2024915-04(23Mi10/04/20PrincesMarlyne 2024/11/(44Nena201711m/Alb10/208-10FrankyNorvJVass2024-10/25/200609/2804Korea[facility] HSeptember 20, 20246515956NovMi10-04-20PrincesMarlyne 11/14/2041Nena09-250m7Alb20-Oct-10-AugFrankyNJVassAugust 07,Oct 25, 200628-Sep08-ApKoreaVictory Medical Center Crai2024/09/20813-55419MiOct 04, 20PrincesMarlyne Nov 14, 2040Nena2017105m-Alb10/22015/FrankyNorviJVass08-Oct 25, 202009-2804KoreaS[facility] CenSep 20, 2024930-1931320108/Mi04-Oct-20PrincesMarlyne 2024-11-(405NenaSeptember 2721m,Alb20-Oct-Aug 10,FrankyNorvillJJVass08-2024-10-2Septemb0609-04KoreaAd[facility] Cl2024/09/20226-3062011/1Mi2024-10-PrincesMarlyne 11/14/2073Nena27-Se64mpAlb2024/108-FrankyNoJVass08/10-25-2SeptembJun 2017-Apr 08Korea[facility] HSeptember 20, 20249843938November MiOct 04, 20PrincesMarlyne 11/14/76Nena098m/Alb2024/108-10FrankyNorvillJuJVass08/10/25/September 28,04Korea[facility] H09-20-24615-29MiOct 04, 20PrincesMarlyne 01-25-19(68Nena09/2Fra36mnAlbOct 20, 08-10FrankyNorvJVass08/01-05-608-282019KoreaSte[facility] Webster HSep 20, 2024507-708115-AMi10/04/PrincesMarlyne Nov 14, 2041Nena09-252m7Alb2024/108/10FrankyNJVassAug 07,10-25-202062017/04[facility] Cen09/20/24219-71Mi2024/10/PrincesMarlyne 11-14-81Nena09/215m7AlbOct 20, Aug 10,FrankyNoJVass2024/10/22017/Apr 08[facility]orea[facility]2024-09-20548-7471Mi04-Oct-20PrincesMarlyne 11/14/(815NenaSeptember 2769m,AlbOctober 20, 2015-FrankyNorvJVass08/10/25/200609-04/0KoreaPhys[facility]2024-09-209047531Nov 0AMiOct 04, 20PrincesMarlyne 01/25/19(862NenaSep 2780m,Alb10-2Aug 10,FrankyNJVass08/05-Jan-2015-09/April 08KoreaLake Region Healthca09-20-24779-53450NovMi10/04/PrincesMarlyne 2024-11-43Nena201736m-Alb10/20/08/10FrankyNJVass08-072024-109/2019Korea[facility] H09-20-2024(320)60845NovMi04-Oct-20PrincesMarlyne 11-14-31Nena0925m-Alb10/22015-FrankyJVass2024/2024/10/2062017/04-0[facility]orea[facility] Cen09-20-24(437) 4077520Mi10/04/20PrincesMarlyne 11-14-91Nena201729m/Alb2024-1Aug 10,FrankyJVass08/0710/25/06Sep 28,04/0KoreaS[name]2024/09/20385-27Mi10/04/20PrincesMarlyne 11-14-90Nena0919m-Alb2024-1Aug 10,FrankyNoJVass08/07Oct 25, 2009-04KoreaNov[facility] 2024-09-20252-02225Nov Mi10/04/20PrincesMarlyne Nov 14, 2066Nena09/245m7Alb10-208-FrankyNorvilJVass08/2024-10-2S062017/08-ApKoreaDesert Peaks Surgery09/20/2024(812) 09(949)03-NMi10-04-20PrincesMarlyne 2024-11-56Nena09-268m7AlbOctober 20, August 10,FrankyNorviJVass08/072024/10/2Sep 28,2019[facility] 2024-09-20819-5161520MiOct 04, 20PrincesMarlyne 11/14/(445Nena09-294m7AlbOctober 20, Aug 10,FrankyNorvilJVass08/10/2June Sep 28,April 08[facility]2024-09-20574-53(440)201MiOctober 04, 20PrincesMarlyne November 14, 2086Nena201753m-Alb10-20-Aug 10,FrankyNorvJVassAugust 07,2024-10-2Septemb2028-Sep08-ApKoreaEye Surgery Center Of North Flor09-20-24431-5552Mi10/04/PrincesMarlyne Nov 14, 2094Nena09/27/2017o410sFisKentuckyhe 

## 2015-12-12 NOTE — Progress Notes (Signed)
PULMONARY / CRITICAL CARE MEDICINE   Name: [name] [name] MRN: [id number] DOB: [birth year]    ADMISSION DATE:  11/22/2015 CONSULTATION DATE:  11/25/2015  REFERRING MD:  Dr. Patria Mane EDP  CHIEF COMPLAINT:  Unresponsive  HISTORY OF PRESENT ILLNESS:   75 year old female with PMH as below, which is significant for DM, HSV, and dementia. She resides at [facility] where she had not been seen for a few days. When staff checked on her 9/5 she was found down and was unresponsive. EMS was called and she was transported to ED. She was intubated for airway protection and was noted to have vomitus on vocal cords, which was also aspirated from ETT. Initially she was hypotensive in ED, which has since improved with IVF resuscitation. CXR consistent with severe aspiration pneumonia. PCCM to admit.  SUBJECTIVE: Continues unresponsive. Sister in room denies status change or evident discomfort. Denies noting cough.   VITAL SIGNS: BP (!) 69/41 (BP Location: Right Arm)   Pulse (!) 111   Temp 97 F (36.1 C) (Axillary)   Resp 12   Ht 5\' 7"  (1.702 m)   Wt 77.3 kg (170 lb 6.7 oz)   SpO2 90%   BMI 26.69 kg/m   HEMODYNAMICS:    VENTILATOR SETTINGS: Vent Mode: PRVC FiO2 (%):  [40 %] 40 % Set Rate:  [12 bmp] 12 bmp Vt Set:  [370 mL] 370 mL PEEP:  [12 cmH20] 12 cmH20 Plateau Pressure:  [20 cmH20] 20 cmH20  INTAKE / OUTPUT: I/O last 3 completed shifts: In: 639.5 [I.V.:639.5] Out: 230 [Urine:130; Stool:100]  PHYSICAL EXAMINATION: General:  extubated, not moving extrems. Neuro:  Not following commands, no response to touch HEENT:  Perrl. Upper airway rattle Cardiovascular:  RRR, no MRG Lungs:  Coarse moderate, unlabored, regular Abdomen:  Soft, non-tender, non-distended. I don't hear BS. Musculoskeletal:  Edema present Skin:  No rash  LABS:  BMET  Recent Labs Lab 11/22/2015 1725 11/17/15 1009 11/18/15 0530  NA 142 142 139  K 3.8 3.5 4.5  CL 117* 118* 109  CO2 18* 17* 20*  BUN 29*  32* 38*  CREATININE 1.58* 1.88* 2.40*  GLUCOSE 196* 246* 219*    Electrolytes  Recent Labs Lab 11/15/2015 1725 11/29/2015 2022 11/17/15 1009 11/18/15 0530  CALCIUM 7.6*  --  7.1* 7.1*  MG  --  1.8 1.4* 2.0  PHOS  --  3.7 2.7 4.5    CBC  Recent Labs Lab 11/27/2015 1725 11/17/15 0500  WBC 10.8* 3.2*  HGB 10.2* 10.5*  HCT 32.7* 34.8*  PLT 151 150    Coag's  Recent Labs Lab 11/12/2015 2022  APTT 30  INR 1.43    Sepsis Markers  Recent Labs Lab 11/22/2015 2022 11/19/2015 2348 11/17/15 0505 11/17/15 0812  LATICACIDVEN 4.9* 4.0* 4.5* 3.4*  PROCALCITON 4.04  --   --   --     ABG  Recent Labs Lab 11/23/2015 1738 11/17/15 0425 11/17/15 1200  PHART 7.222* 7.123* 7.180*  PCO2ART 45.4 47.5 38.7  PO2ART 289.0* 152* 429.0*    Liver Enzymes  Recent Labs Lab 12/07/2015 1725  AST 25  ALT 13*  ALKPHOS 55  BILITOT 0.4  ALBUMIN 2.6*    Cardiac Enzymes No results for input(s): TROPONINI, PROBNP in the last 168 hours.  Glucose  Recent Labs Lab 11/17/15 1134 11/17/15 1614 11/17/15 2031 11/17/15 2324 11/18/15 0356 11/18/15 0712  GLUCAP 233* 181* 146* 155* 147* 202*    Imaging No results found.   STUDIES:  CT head 9/5 > neg for acute abnormalities  CT chest 9/5 > Diffuse tree-in-bud infiltrates, ground-glass opacities in RIGHT lower lobe consolidation consistent with aspiration pneumonia. Debris in RIGHT lower lobe bronchi. CT abdomen/pelvis 9/5 > Stool distended rectum. Fluid in small and large bowel suggest enteritis without bowel obstruction. Nasogastric tube tip in mid stomach. 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  CULTURES: BCx2 9/5 > Tracheal aspirate 9/5 > Urine CX 9/5  ANTIBIOTICS: Zosyn 9/5 >off Vancomycin 9/5 >off  SIGNIFICANT EVENTS: 9/6- shock, levophed high, line placed  LINES/TUBES: ETT 9/5 > Left IJ 9/6>>>  DISCUSSION: 75 year old female who resides in SNF found down. Intubated in ICU. Admit for aspiration  PNA. Treated with zosyn and vanco. Extubated. DNR, Comfort care   ASSESSMENT / PLAN:  PULMONARY A: Acute hypoxemic respiratory failure secondary to aspiration PNA Aspiration ARDS P:   Continues comfort care. Survival not expected Ensure rate 12  titrate morphine to rr 12-18 and comfort care No pcxr needed Off ABX  CARDIOVASCULAR A:  Hypotension, septic shock / SIRS Hx HLD, HTN Rel AI P:  Telemetry monitoring Levophed to off noted No role restart  RENAL A:   AKI, likely volume depletion  CK normal, no rhabdo NONAG P:   ARF, 20 cc output No role further therapy   GASTROINTESTINAL A:   GERD R/o cdiff At risk dead bowel P:   NPO  Dc TF done Non-distended, without audible bowel sounds  HEMATOLOGIC A:   Anemia, mild (at baseline) Mild Leukopenia- acute illness  P:  Dc labs  INFECTIOUS A:   Aspiration pneumonia R./o gastroenteritis P:   Treat fevers Off abx  ENDOCRINE A:   DM Rel AI P:   Dc cbg Stress roids- dc  NEUROLOGIC A:   Acute metabolic encephalopathy Vascular dementia Hx CVA Comfort care planned P:   morphine drip required  FAMILY  - Updates: await med poa to bedside now at 9 am , will update  - Inter-disciplinary family meet or Palliative Care meeting due by:  9/12    CD Maple HudsonYoung, MD [facility] p [phone] After 3:00 PM   [phone]

## 2015-12-12 NOTE — Discharge Summary (Signed)
   Name: [name] [name] MRN: [id number] DOB: 01/14/1941    ADMISSION DATE:  12/09/2015 CONSULTATION DATE:  11/17/2015 Date of death 11/19/15  REFERRING MD : Dr Patria Maneampos EDP  HISTORY OF PRESENT ILLNESS:   75 year old female with PMH as below, which is significant for DM, HSV, and dementia. She resides at [facility] where she had not been seen for a few days. When staff checked on her 9/5 she was found down and was unresponsive. EMS was called and she was transported to ED. She was intubated for airway protection and was noted to have vomitus on vocal cords, which was also aspirated from ETT. Initially she was hypotensive in ED, which has since improved with IVF resuscitation. CXR consistent with severe aspiration pneumonia. PCCM to admit. SUBJECTIVE: refractory shock on max levo, min urine output   STUDIES:  CT head 9/5 > neg for acute abnormalities  CT chest 9/5 > Diffuse tree-in-bud infiltrates, ground-glass opacities in RIGHT lower lobe consolidation consistent with aspiration pneumonia. Debris in RIGHT lower lobe bronchi. CT abdomen/pelvis 9/5 > Stool distended rectum. Fluid in small and large bowel suggest enteritis without bowel obstruction. Nasogastric tube tip in mid stomach. 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 CULTURES: BCx2 9/5 > 1/2 GPC clusters>>> Tracheal aspirate 9/5 > Urine CX 9/5  ANTIBIOTICS: Zosyn 9/5 >>> Vancomycin 9/5 >>>  PAST MEDICAL HISTORY :   has a past medical history of Altered mental status; Cerebrovascular disease; Congenital coronary artery anomaly; Constipation; Dementia; Depression; Diabetes mellitus; Esophageal reflux; Generalized abdominal pain; Herpes simplex; Hyperlipidemia; and Urinary tract infection.  has no past surgical history on file.  Hospital Course: Assessment Shock with sepsis, aspiration pneumonia, acute metabolic encephalopathy. Treated with ventilator support, iv fluids/ sepsis protocol, pressors. Poor prognosis  recognized. Family opted for comfort care, DNR. Patient was terminally extubated and died comfortably. No autopsy.  CD Maple HudsonYoung, MD Pulmonary and Critical Care Medicine [facility] Pager: [phone]  12/02/2015, 9:20 PM

## 2015-12-12 DEATH — deceased

## 2017-01-09 IMAGING — CT CT ABD-PELV W/ CM
2 of 5 series · 12 of 36 positions shown, 15 images · IV contrast (iopamidol)
Comparison: Chest radiograph November 16, 2015 at 9501 hours

CLINICAL DATA: Found down at nursing facility today. Aspiration,
hypotensive. Followup severe aspiration pneumonia. History of
diabetes, stroke and dementia.

EXAM:
CT CHEST, ABDOMEN, AND PELVIS WITH CONTRAST
TECHNIQUE: Multidetector CT imaging of the chest, abdomen and pelvis was
performed following the standard protocol during bolus
administration of intravenous contrast.
CONTRAST:  75mL [id number] IOPAMIDOL ([id number]-[id number]) INJECTION 61%

[Series 2: cap with 5mm st · axial · 0.72mm/px · z∈[-888,-348]mm · 9 of 133 slices shown, 12 images]
[im 13/133  mediastinal]
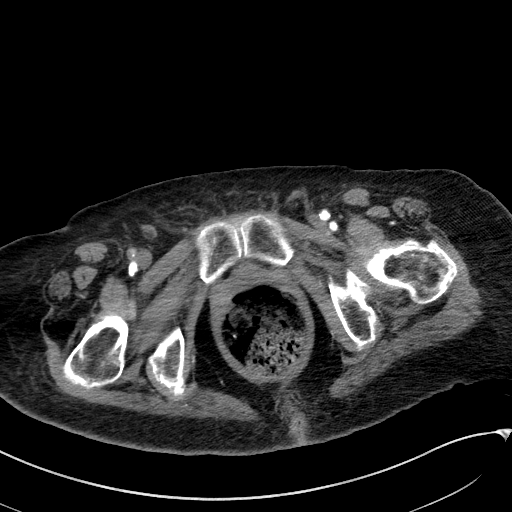
[im 13/133  lung]
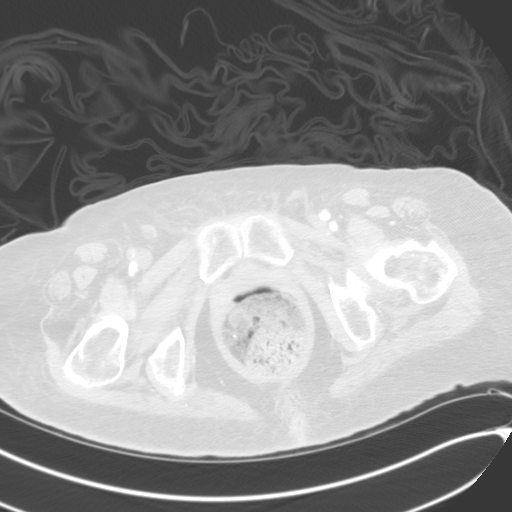
[im 25/133  lung]
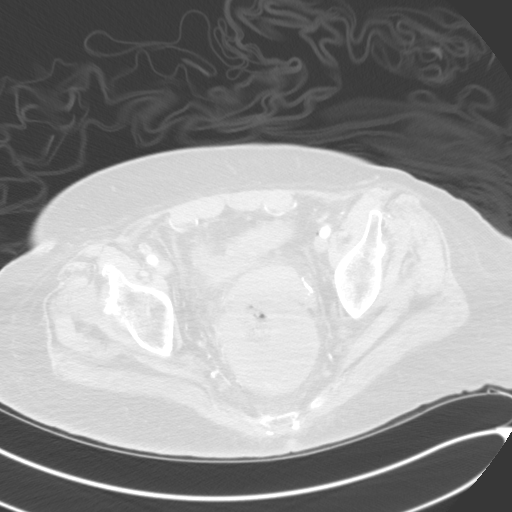
[im 37/133  lung]
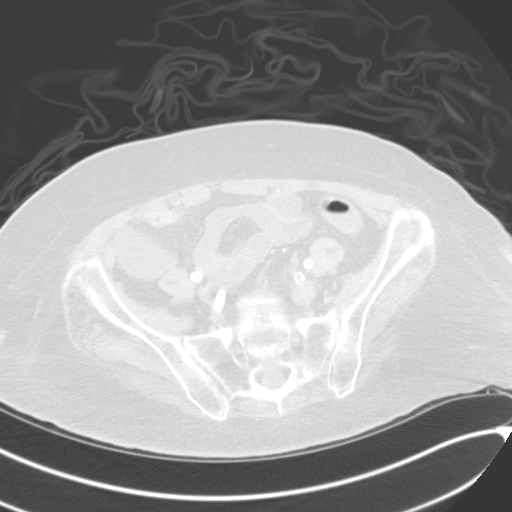
[im 49/133  lung]
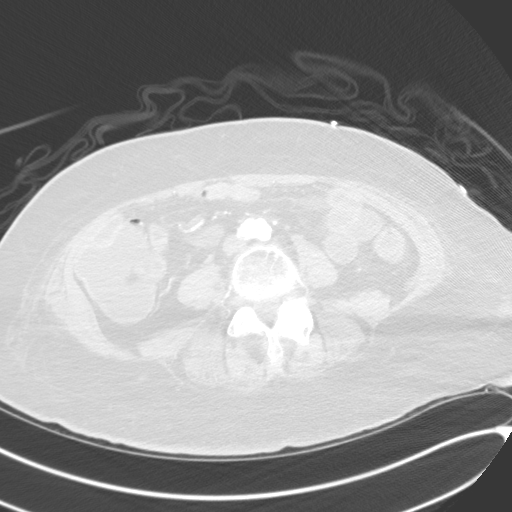
[im 73/133  mediastinal]
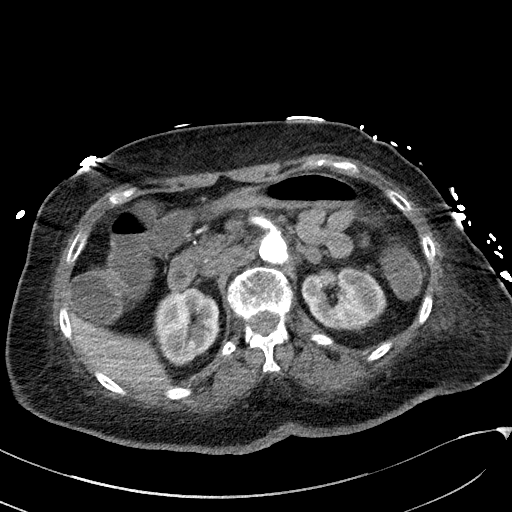
[im 73/133  lung]
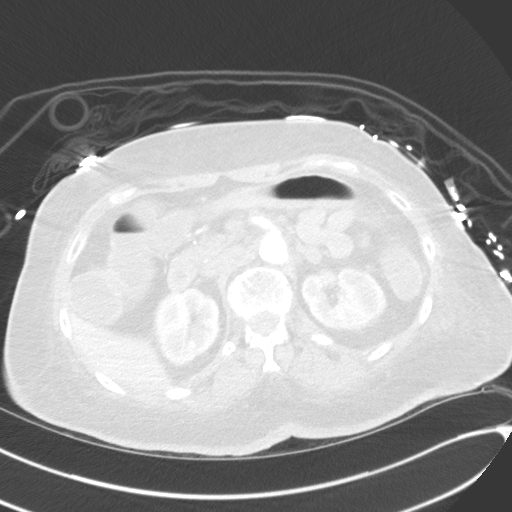
[im 85/133  lung]
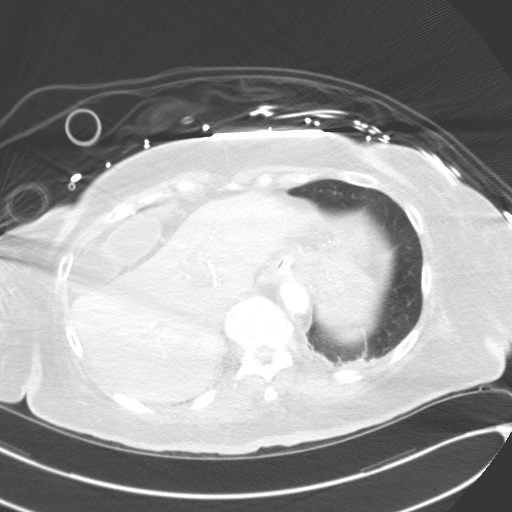
[im 97/133  lung]
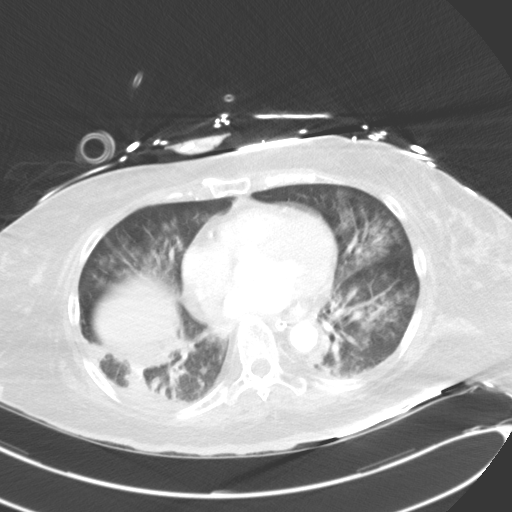
[im 109/133  lung]
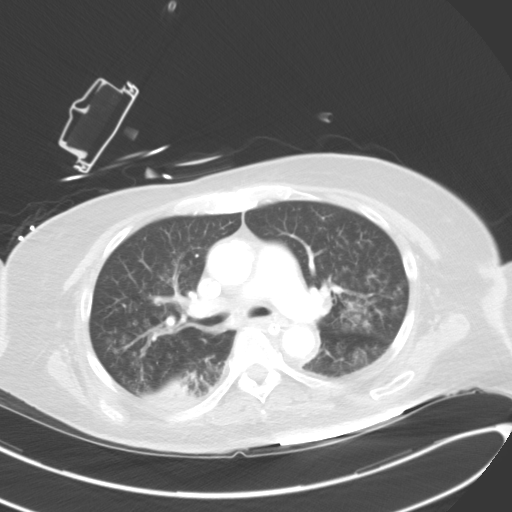
[im 121/133  mediastinal]
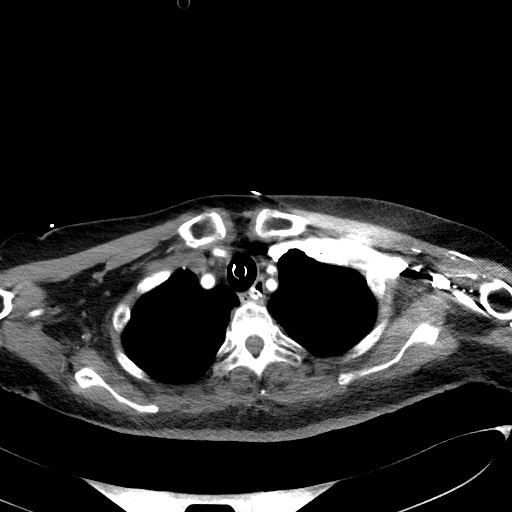
[im 121/133  lung]
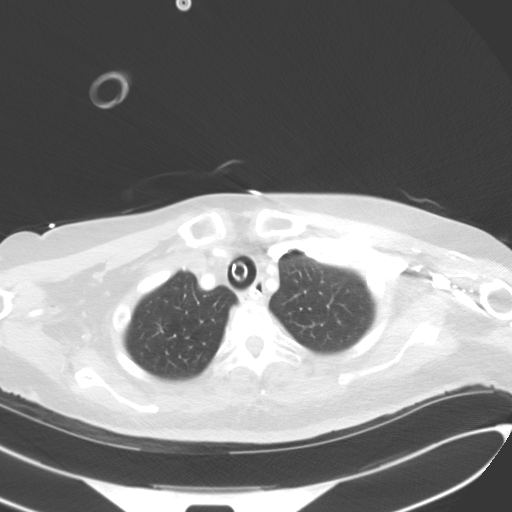

[Series 4: cap with 3mm st cor · coronal · 0.65mm/px · 3 of 121 slices shown]
[im 25/121  lung]
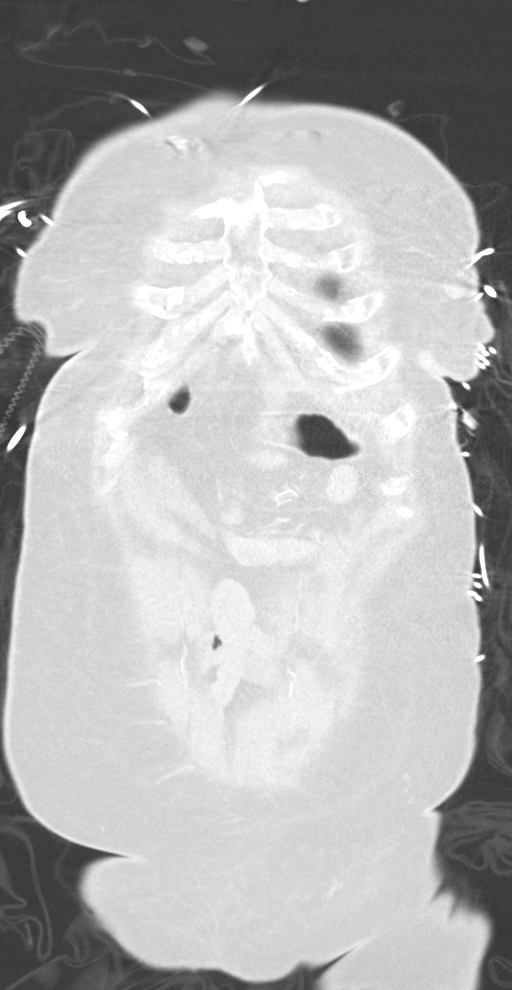
[im 49/121  lung]
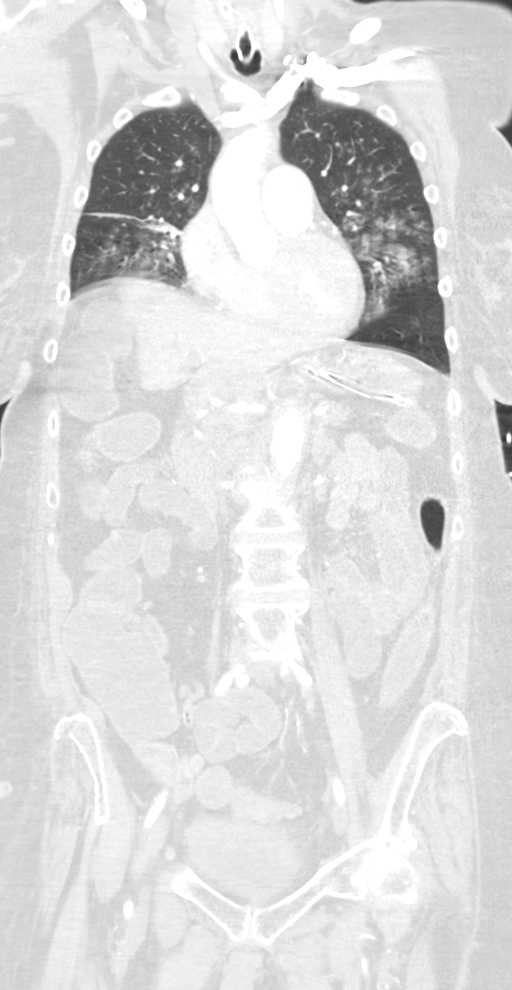
[im 73/121  lung]
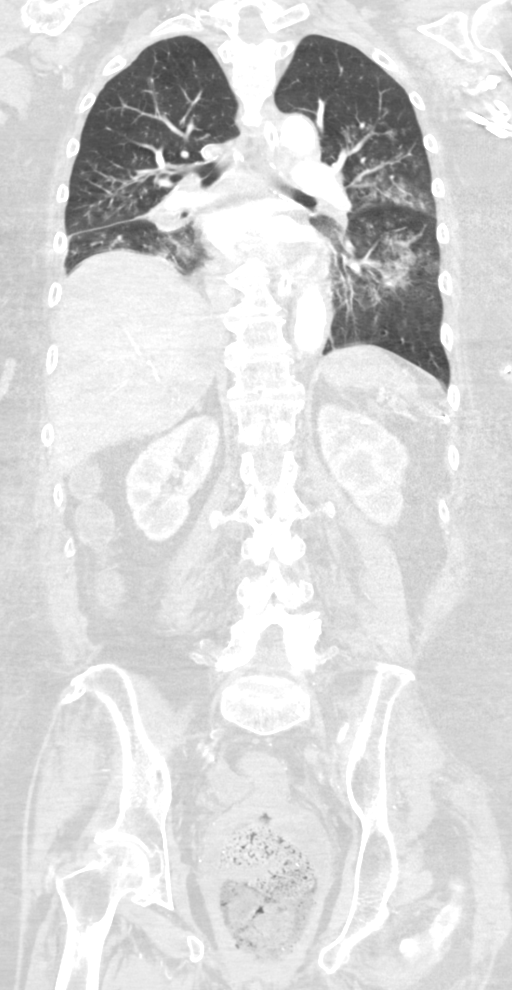

[12 of 36 positions shown; findings below may reference images not displayed]

FINDINGS: CT CHEST FINDINGS

CARDIOVASCULAR: Heart size is normal. Apical thinning LEFT
ventricle. Trace pericardial effusion. Moderate calcific
atherosclerosis of the thoracic aorta which is normal in course and
caliber.

MEDIASTINUM/NODES: No mediastinal mass. No lymphadenopathy by CT
size criteria. Normal appearance of thoracic esophagus though not
tailored for evaluation.

LUNGS/PLEURA: Endotracheal tube tip just above the carina. Debris
within the RIGHT lower lobe bronchi. Mild LEFT lower lobe
bronchiectasis. Diffuse tree-in-bud infiltrates, and ground-glass
centrilobular nodules with RIGHT lower lobe consolidation. No
pleural effusion. No pneumothorax.

MUSCULOSKELETAL: Heterogeneous thyroid gland without dominant
nodule. Coarse calcifications LEFT breast. Osteopenia. Linear
sclerosis RIGHT T8 rib, nonspecific and nonacute.

CT ABDOMEN PELVIS FINDINGS

HEPATOBILIARY: Liver and gallbladder are normal.

PANCREAS: Normal.

SPLEEN: Normal.

ADRENALS/URINARY TRACT: Kidneys are orthotopic, demonstrating
symmetric enhancement. No nephrolithiasis, hydronephrosis or solid
renal masses. The unopacified ureters are normal in course and
caliber. Delayed imaging through the kidneys without contrast
excretion. Urinary bladder is partially distended and unremarkable.
Normal adrenal glands.

STOMACH/BOWEL: Stool distended rectum. Colonic air-fluid levels,
fluid and small bowel. Nasogastric tube terminates in mid stomach.
Colonic intra positioning. No bowel obstruction.

VASCULAR/LYMPHATIC: Aortoiliac vessels are normal in course and
caliber, a chronic calcified infrarenal aortic dissection. Moderate
to severe calcific atherosclerosis. Occluded LEFT internal iliac
artery. RIGHT femoral artery occlusion. No lymphadenopathy by CT
size criteria.

REPRODUCTIVE: Normal.

OTHER: No intraperitoneal free fluid or free air.

MUSCULOSKELETAL: Nonacute.
IMPRESSION: CT CHEST: Diffuse tree-in-bud infiltrates, ground-glass opacities in
RIGHT lower lobe consolidation consistent with aspiration pneumonia.
Debris in RIGHT lower lobe bronchi.

Endotracheal tube tip just above the carina.

CT ABDOMEN AND PELVIS: Stool distended rectum. Fluid in small and
large bowel suggest enteritis without bowel obstruction. Nasogastric
tube tip in mid stomach.

Occluded LEFT internal iliac and RIGHT femoral artery's.

## 2017-01-10 IMAGING — CR DG CHEST 1V PORT
1 series · 1 of 1 positions shown · non-contrast
Comparison: Chest radiograph 11/16/2015 and chest CT 11/16/2015

CLINICAL DATA: Endotracheal tube check.  Acute respiratory failure.

EXAM:
PORTABLE CHEST 1 VIEW

[AP]
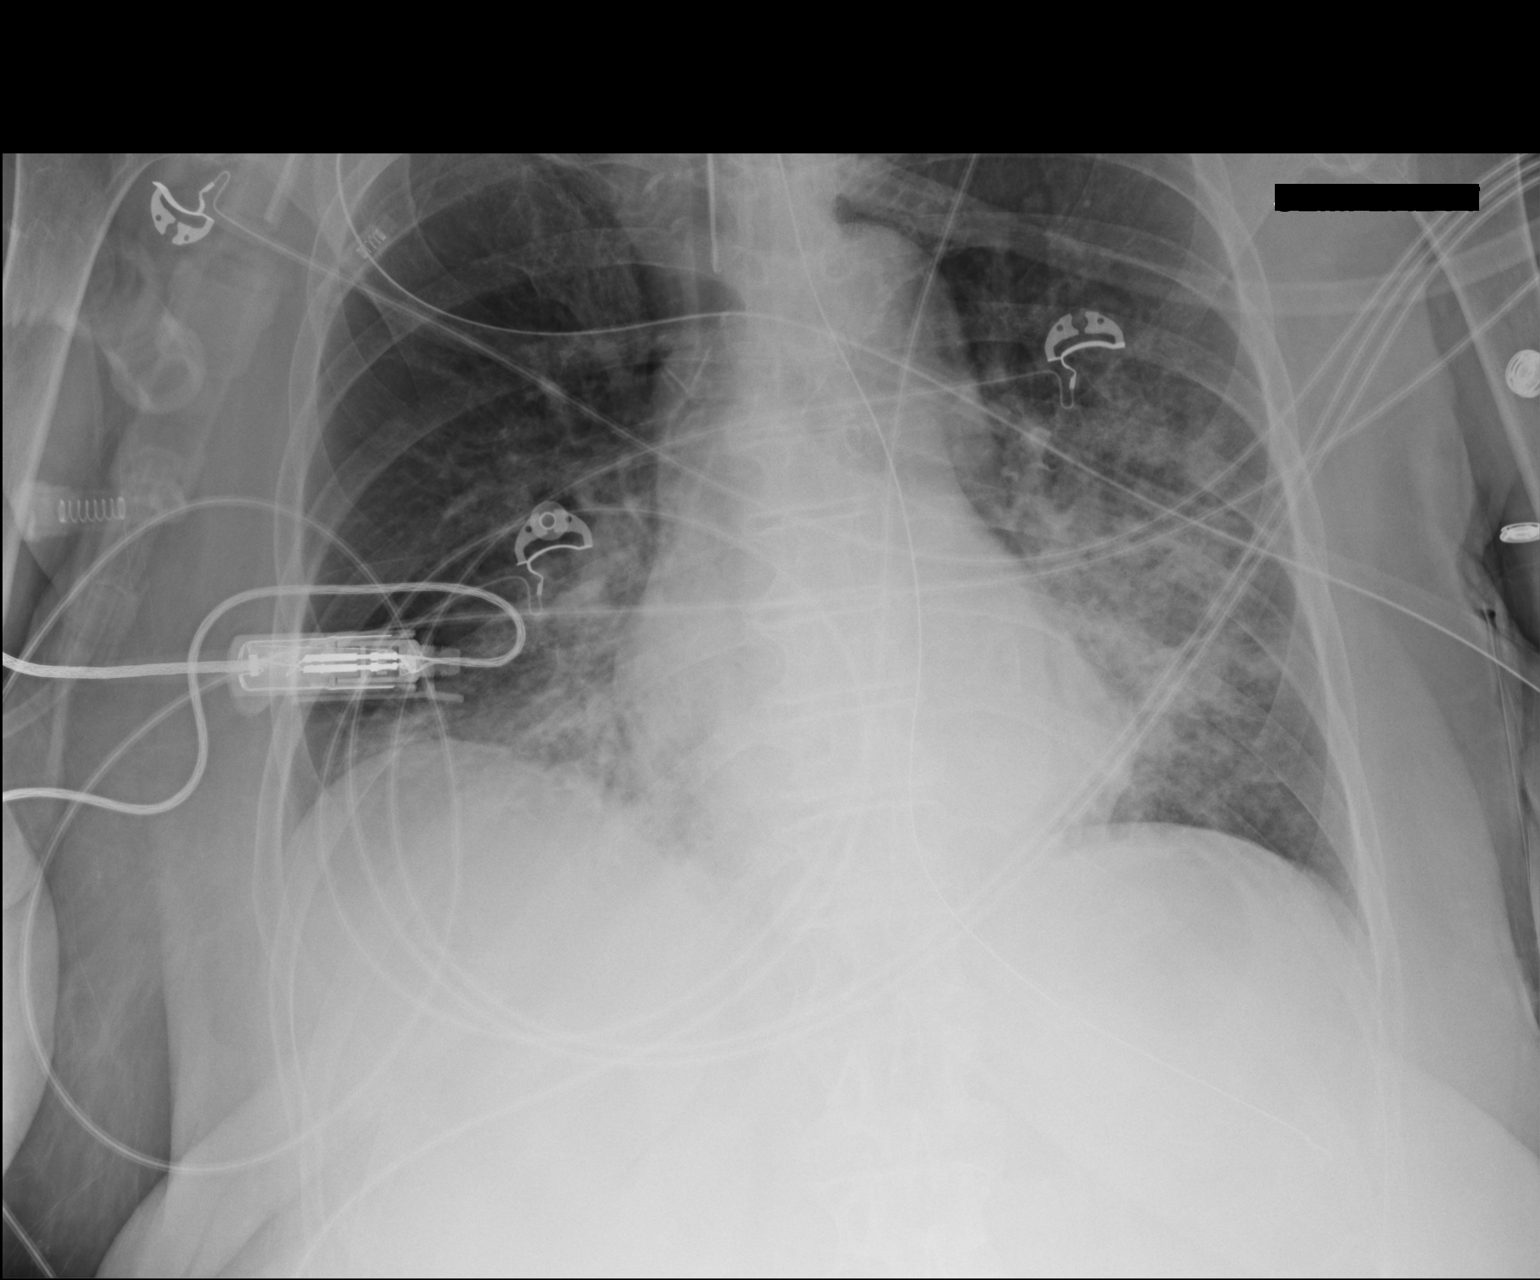

[1 of 1 positions shown; findings below may reference images not displayed]

FINDINGS: Support apparatus is unchanged. There is improved aeration at the
right lung base. Patchy parenchymal opacities in the left lung have
worsened from the prior study. No pneumothorax or sizable pleural
effusion. Cardiomediastinal contours are unchanged.
IMPRESSION: 1. Worsening opacities within the left lung, possibly indicating
pulmonary edema versus progression of aspiration pneumonitis.
2. Improved aeration of the right lung base.
